# Patient Record
Sex: Male | Born: 1965 | Race: White | Hispanic: No | Marital: Married | Smoking: Former smoker
Health system: Southern US, Community
[De-identification: ages and names within clinical notes are randomized; demographics above are authoritative.]

## PROBLEM LIST (undated history)

## (undated) DIAGNOSIS — E785 Hyperlipidemia, unspecified: Secondary | ICD-10-CM

## (undated) DIAGNOSIS — E0590 Thyrotoxicosis, unspecified without thyrotoxic crisis or storm: Principal | ICD-10-CM

## (undated) DIAGNOSIS — G43909 Migraine, unspecified, not intractable, without status migrainosus: Secondary | ICD-10-CM

## (undated) DIAGNOSIS — I10 Essential (primary) hypertension: Secondary | ICD-10-CM

## (undated) DIAGNOSIS — R011 Cardiac murmur, unspecified: Secondary | ICD-10-CM

## (undated) DIAGNOSIS — E039 Hypothyroidism, unspecified: Secondary | ICD-10-CM

## (undated) HISTORY — DX: Hyperlipidemia, unspecified: E78.5

## (undated) HISTORY — DX: Migraine, unspecified, not intractable, without status migrainosus: G43.909

## (undated) HISTORY — DX: Cardiac murmur, unspecified: R01.1

## (undated) HISTORY — DX: Thyrotoxicosis, unspecified without thyrotoxic crisis or storm: E05.90

---
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
## 2004-11-10 HISTORY — PX: COLONOSCOPY: SHX174

## 2005-02-24 ENCOUNTER — Ambulatory Visit: Payer: Self-pay | Admitting: Family Medicine

## 2005-03-10 ENCOUNTER — Ambulatory Visit: Payer: Self-pay | Admitting: Gastroenterology

## 2005-03-12 ENCOUNTER — Ambulatory Visit: Payer: Self-pay | Admitting: Gastroenterology

## 2005-03-19 ENCOUNTER — Ambulatory Visit: Payer: Self-pay | Admitting: Gastroenterology

## 2005-04-08 ENCOUNTER — Ambulatory Visit: Payer: Self-pay | Admitting: Gastroenterology

## 2005-09-03 ENCOUNTER — Ambulatory Visit: Payer: Self-pay | Admitting: Family Medicine

## 2005-09-09 ENCOUNTER — Ambulatory Visit: Payer: Self-pay | Admitting: Family Medicine

## 2005-12-25 ENCOUNTER — Ambulatory Visit: Payer: Self-pay | Admitting: Family Medicine

## 2007-01-27 ENCOUNTER — Ambulatory Visit: Payer: Self-pay | Admitting: Family Medicine

## 2007-01-27 LAB — CONVERTED CEMR LAB
ALT: 30 units/L (ref 0–40)
Basophils Relative: 0.4 % (ref 0.0–1.0)
Bilirubin, Direct: 0.1 mg/dL (ref 0.0–0.3)
CO2: 32 meq/L (ref 19–32)
Calcium: 9.4 mg/dL (ref 8.4–10.5)
Eosinophils Absolute: 0.1 10*3/uL (ref 0.0–0.6)
Eosinophils Relative: 2.2 % (ref 0.0–5.0)
GFR calc Af Amer: 121 mL/min
Glucose, Bld: 99 mg/dL (ref 70–99)
HCT: 46.3 % (ref 39.0–52.0)
Hemoglobin: 15.8 g/dL (ref 13.0–17.0)
Lymphocytes Relative: 28.3 % (ref 12.0–46.0)
MCV: 88.6 fL (ref 78.0–100.0)
Monocytes Absolute: 0.7 10*3/uL (ref 0.2–0.7)
Neutro Abs: 3.9 10*3/uL (ref 1.4–7.7)
Neutrophils Relative %: 57.9 % (ref 43.0–77.0)
Potassium: 4.1 meq/L (ref 3.5–5.1)
Sodium: 146 meq/L — ABNORMAL HIGH (ref 135–145)
TSH: 0.86 microintl units/mL (ref 0.35–5.50)
Total Protein: 7 g/dL (ref 6.0–8.3)
VLDL: 23 mg/dL (ref 0–40)
WBC: 6.5 10*3/uL (ref 4.5–10.5)

## 2008-02-16 ENCOUNTER — Ambulatory Visit: Payer: Self-pay | Admitting: Family Medicine

## 2008-02-16 DIAGNOSIS — E785 Hyperlipidemia, unspecified: Secondary | ICD-10-CM

## 2008-02-22 ENCOUNTER — Ambulatory Visit: Payer: Self-pay | Admitting: Family Medicine

## 2008-02-22 LAB — CONVERTED CEMR LAB
Ketones, urine, test strip: NEGATIVE
Nitrite: NEGATIVE
Specific Gravity, Urine: 1.015
pH: 8.5

## 2008-02-28 LAB — CONVERTED CEMR LAB
ALT: 27 units/L (ref 0–53)
BUN: 12 mg/dL (ref 6–23)
Basophils Relative: 0.1 % (ref 0.0–1.0)
CO2: 30 meq/L (ref 19–32)
Calcium: 9.4 mg/dL (ref 8.4–10.5)
Cholesterol: 168 mg/dL (ref 0–200)
Creatinine, Ser: 0.9 mg/dL (ref 0.4–1.5)
Glucose, Bld: 91 mg/dL (ref 70–99)
Hemoglobin: 15.5 g/dL (ref 13.0–17.0)
LDL Cholesterol: 96 mg/dL (ref 0–99)
Lymphocytes Relative: 29.2 % (ref 12.0–46.0)
Monocytes Relative: 9.4 % (ref 3.0–12.0)
Neutro Abs: 3.8 10*3/uL (ref 1.4–7.7)
RBC: 5.16 M/uL (ref 4.22–5.81)
TSH: 1.05 microintl units/mL (ref 0.35–5.50)
Total CHOL/HDL Ratio: 3.6
Total Protein: 6.9 g/dL (ref 6.0–8.3)

## 2008-07-05 ENCOUNTER — Ambulatory Visit: Payer: Self-pay | Admitting: Family Medicine

## 2008-07-05 DIAGNOSIS — K219 Gastro-esophageal reflux disease without esophagitis: Secondary | ICD-10-CM | POA: Insufficient documentation

## 2009-04-12 ENCOUNTER — Ambulatory Visit: Payer: Self-pay | Admitting: Family Medicine

## 2009-04-12 LAB — CONVERTED CEMR LAB
Glucose, Urine, Semiquant: NEGATIVE
Specific Gravity, Urine: 1.02
WBC Urine, dipstick: NEGATIVE
pH: 7

## 2009-04-13 ENCOUNTER — Encounter: Payer: Self-pay | Admitting: Family Medicine

## 2009-04-13 LAB — CONVERTED CEMR LAB
ALT: 26 units/L (ref 0–53)
Basophils Relative: 0.4 % (ref 0.0–3.0)
Bilirubin, Direct: 0.2 mg/dL (ref 0.0–0.3)
Eosinophils Relative: 1.2 % (ref 0.0–5.0)
GFR calc non Af Amer: 98.97 mL/min (ref >60)
Glucose, Bld: 87 mg/dL (ref 70–99)
HCT: 45 % (ref 39.0–52.0)
Hemoglobin: 15.4 g/dL (ref 13.0–17.0)
Monocytes Relative: 11.7 % (ref 3.0–12.0)
Potassium: 4.2 meq/L (ref 3.5–5.1)
RBC: 4.95 M/uL (ref 4.22–5.81)
RDW: 12.3 % (ref 11.5–14.6)
Sodium: 142 meq/L (ref 135–145)
Total Bilirubin: 2 mg/dL — ABNORMAL HIGH (ref 0.3–1.2)
VLDL: 17.4 mg/dL (ref 0.0–40.0)

## 2009-04-23 ENCOUNTER — Ambulatory Visit: Payer: Self-pay | Admitting: Family Medicine

## 2009-04-23 DIAGNOSIS — N508 Other specified disorders of male genital organs: Secondary | ICD-10-CM

## 2009-04-23 DIAGNOSIS — G43909 Migraine, unspecified, not intractable, without status migrainosus: Secondary | ICD-10-CM

## 2009-04-26 ENCOUNTER — Encounter: Admission: RE | Admit: 2009-04-26 | Discharge: 2009-04-26 | Payer: Self-pay | Admitting: Family Medicine

## 2009-10-26 ENCOUNTER — Ambulatory Visit: Payer: Self-pay | Admitting: Family Medicine

## 2009-10-26 LAB — CONVERTED CEMR LAB
Albumin: 4.5 g/dL (ref 3.5–5.2)
Cholesterol: 142 mg/dL (ref 0–200)
HDL: 55.8 mg/dL (ref >39.00)
LDL Cholesterol: 74 mg/dL (ref 0–99)
Total Protein: 6.9 g/dL (ref 6.0–8.3)
Triglycerides: 63 mg/dL (ref 0.0–149.0)
VLDL: 12.6 mg/dL (ref 0.0–40.0)

## 2010-05-03 ENCOUNTER — Telehealth: Payer: Self-pay | Admitting: Family Medicine

## 2010-12-10 NOTE — Progress Notes (Signed)
Summary: new rx  Phone Note Call from Patient Call back at [phone]   Caller: Patient Call For: Nelwyn Salisbury MD Summary of Call: pt new sumatriptan 100 mg [phone] Initial call taken by: Heron Sabins,  May 03, 2010 1:46 PM  Follow-up for Phone Call        call in #12 with 11 rf Follow-up by: Nelwyn Salisbury MD,  May 03, 2010 3:00 PM    Prescriptions: SUMATRIPTAN SUCCINATE 100 MG TABS (SUMATRIPTAN SUCCINATE) as needed  #12 x 11   Entered by:   Raechel Ache, RN   Authorized by:   Nelwyn Salisbury MD   Signed by:   Raechel Ache, RN on 05/03/2010   Method used:   Electronically to        [facility]  [phone]* (retail)       [address] [place], [place]  [address]       Ph: [phone] or [phone]       Fax: [phone]   RxID:   [id number]

## 2011-01-09 ENCOUNTER — Other Ambulatory Visit: Payer: Self-pay | Admitting: Family Medicine

## 2011-01-24 ENCOUNTER — Encounter: Payer: Self-pay | Admitting: Family Medicine

## 2011-01-27 ENCOUNTER — Ambulatory Visit (INDEPENDENT_AMBULATORY_CARE_PROVIDER_SITE_OTHER): Payer: BC Managed Care – PPO | Admitting: Internal Medicine

## 2011-01-27 ENCOUNTER — Encounter: Payer: Self-pay | Admitting: Internal Medicine

## 2011-01-27 DIAGNOSIS — R51 Headache: Secondary | ICD-10-CM

## 2011-01-27 MED ORDER — SUMATRIPTAN SUCCINATE 100 MG PO TABS: 100.0000 mg | ORAL_TABLET | ORAL | Status: DC | PRN

## 2011-02-01 ENCOUNTER — Encounter: Payer: Self-pay | Admitting: Internal Medicine

## 2011-02-01 NOTE — Assessment & Plan Note (Signed)
Recent increase in HA pattern but without warning signs. Attempt sample of maxalt 10mg  mlt to evaluate effectiveness. If no better than maxalt RF maxalt with prescription provided. Consider neurology consult if pattern remains atypical.

## 2011-02-01 NOTE — Progress Notes (Signed)
  Subjective:    Patient ID: Derrick Medina, male    DOB: [birth year], 45 y.o.   MRN: [id number]  HPI Pt presents to clinic for evaluation of HA's. Notes longstanding h/o intermittent ha's thought to be migraines. Typically occur with season changes however have worsened over the last one month without obvious trigger. Occur daily for several days though last ha recently occurred 3d ago. Has associated nausea and photophobia but specifically denies neurologic sx's such as numbness, weakness, visual changes or difficulty with speech. No aura. Has responded well in the past to imitrex but recently notes less effectiveness. No obvious other aleviating or exacerbating factors. No other complaints.  Reviewed pmh, medications and allergies.    Review of Systems  Constitutional: Negative for fever and chills.  Neurological: Positive for headaches. Negative for tremors, seizures, syncope, speech difficulty, weakness and numbness.       Objective:   Physical Exam  Nursing note and vitals reviewed. Constitutional: He appears well-developed and well-nourished. No distress.  Neurological: He is alert.  Psychiatric: He has a normal mood and affect.          Assessment & Plan:

## 2011-05-26 ENCOUNTER — Ambulatory Visit (INDEPENDENT_AMBULATORY_CARE_PROVIDER_SITE_OTHER): Payer: BC Managed Care – PPO | Admitting: Family Medicine

## 2011-05-26 ENCOUNTER — Encounter: Payer: Self-pay | Admitting: Family Medicine

## 2011-05-26 VITALS — BP 110/70 | Temp 98.7°F | Wt 157.0 lb

## 2011-05-26 DIAGNOSIS — H00019 Hordeolum externum unspecified eye, unspecified eyelid: Secondary | ICD-10-CM

## 2011-05-26 MED ORDER — TOBRAMYCIN 0.3 % OP SOLN: 1.0000 [drp] | OPHTHALMIC | Status: AC

## 2011-05-26 NOTE — Patient Instructions (Signed)
Continue with warm compresses several times daily. Touch base or follow up immediately for any vision changes or worsening pain.

## 2011-05-26 NOTE — Progress Notes (Signed)
  Subjective:    Patient ID: Derrick Medina, male    DOB: 11-Feb-1966, 45 y.o.   MRN: [id number]  HPI Patient seen with pain in left upper eyelid that started last Thursday. Has noticed a little crusted drainage from around lid past couple of days. No blurred vision. No eye injury. No periocular rash no contact use. Patient noticed white colored bump underneath the left upper lateral lid. Use warm compresses only once or twice over the weekend. Symptoms actually slightly improved today   Review of Systems  Constitutional: Negative for fever and chills.  Eyes: Positive for pain and discharge. Negative for photophobia, itching and visual disturbance.       Objective:   Physical Exam  Constitutional: He appears well-developed and well-nourished. No distress.  Eyes: Pupils are equal, round, and reactive to light.       Very mild edema left upper lid. Conjunctiva appears normal. Pupils equal round reactive to light. Cornea appears normal. Funduscopic exam is unremarkable. With eversion of the left upper lid he has small stye on the lateral portion.  No vesicles involving eyelid or face.  Cardiovascular: Normal rate and regular rhythm.   Skin: No rash noted.          Assessment & Plan:  Stye left upper mid. Warm compresses several times daily. Tobrex solution 2 drops left eye every 4 hours while awake

## 2011-05-27 ENCOUNTER — Ambulatory Visit: Payer: BC Managed Care – PPO | Admitting: Family Medicine

## 2011-10-22 ENCOUNTER — Encounter: Payer: Self-pay | Admitting: Family Medicine

## 2011-10-22 ENCOUNTER — Ambulatory Visit (INDEPENDENT_AMBULATORY_CARE_PROVIDER_SITE_OTHER): Payer: BC Managed Care – PPO | Admitting: Family Medicine

## 2011-10-22 VITALS — BP 158/98 | HR 107 | Temp 98.7°F | Wt 149.0 lb

## 2011-10-22 DIAGNOSIS — S92919A Unspecified fracture of unspecified toe(s), initial encounter for closed fracture: Secondary | ICD-10-CM

## 2011-10-22 DIAGNOSIS — Z23 Encounter for immunization: Secondary | ICD-10-CM

## 2011-10-22 NOTE — Progress Notes (Signed)
  Subjective:    Patient ID: Derrick Medina, male    DOB: Feb 17, 1966, 45 y.o.   MRN: [id number]  HPI Last night at home he walked into a dog barrier and hit the right 5th toe. It was bent out to the side, and he pushed it back in a straight line. He felt a lot of crunching when he did this. The toe is painful and swollen. He has done nothing for this.    Review of Systems  Constitutional: Negative.   Musculoskeletal: Positive for arthralgias.       Objective:   Physical Exam  Constitutional:       limping  Musculoskeletal:       The right 5th toe is ecchymotic , swollen, and tender. There is crepitus present. Alignment is good. No tenderness in the forefoot           Assessment & Plan:  Probable broken toe. Wear comfortable shoes. Stay off his feet and keep it elevated. Use ice packs. Use Motrin. Get an Xray

## 2011-11-21 ENCOUNTER — Encounter: Payer: Self-pay | Admitting: Family Medicine

## 2011-11-21 ENCOUNTER — Ambulatory Visit (INDEPENDENT_AMBULATORY_CARE_PROVIDER_SITE_OTHER): Payer: BC Managed Care – PPO | Admitting: Family Medicine

## 2011-11-21 VITALS — BP 144/88 | HR 104 | Temp 98.3°F | Wt 149.0 lb

## 2011-11-21 DIAGNOSIS — E0590 Thyrotoxicosis, unspecified without thyrotoxic crisis or storm: Secondary | ICD-10-CM

## 2011-11-21 DIAGNOSIS — E785 Hyperlipidemia, unspecified: Secondary | ICD-10-CM

## 2011-11-21 DIAGNOSIS — R634 Abnormal weight loss: Secondary | ICD-10-CM

## 2011-11-21 LAB — POCT URINALYSIS DIPSTICK
Bilirubin, UA: NEGATIVE
Ketones, UA: NEGATIVE
Protein, UA: NEGATIVE
Spec Grav, UA: 1.02
pH, UA: 6

## 2011-11-21 LAB — BASIC METABOLIC PANEL
Chloride: 105 mEq/L (ref 96–112)
GFR: 184.06 mL/min (ref >60.00)
Potassium: 4.1 mEq/L (ref 3.5–5.1)

## 2011-11-21 LAB — T4, FREE: Free T4: 4.3 ng/dL — ABNORMAL HIGH (ref 0.60–1.60)

## 2011-11-21 LAB — CBC WITH DIFFERENTIAL/PLATELET
Basophils Relative: 0.2 % (ref 0.0–3.0)
Eosinophils Relative: 3.8 % (ref 0.0–5.0)
MCV: 81.7 fl (ref 78.0–100.0)
Monocytes Absolute: 0.8 10*3/uL (ref 0.1–1.0)
Monocytes Relative: 11.9 % (ref 3.0–12.0)
Neutrophils Relative %: 50.7 % (ref 43.0–77.0)
Platelets: 172 10*3/uL (ref 150.0–400.0)
RBC: 5.36 Mil/uL (ref 4.22–5.81)
WBC: 6.9 10*3/uL (ref 4.5–10.5)

## 2011-11-21 LAB — HEPATIC FUNCTION PANEL
ALT: 64 U/L — ABNORMAL HIGH (ref 0–53)
AST: 34 U/L (ref 0–37)
Bilirubin, Direct: 0.2 mg/dL (ref 0.0–0.3)
Total Bilirubin: 1.9 mg/dL — ABNORMAL HIGH (ref 0.3–1.2)

## 2011-11-21 LAB — TSH: TSH: 0.1 u[IU]/mL — ABNORMAL LOW (ref 0.35–5.50)

## 2011-11-21 LAB — LIPID PANEL
Cholesterol: 181 mg/dL (ref 0–200)
HDL: 48 mg/dL (ref >39.00)
LDL Cholesterol: 112 mg/dL — ABNORMAL HIGH (ref 0–99)
Total CHOL/HDL Ratio: 4
Triglycerides: 105 mg/dL (ref 0.0–149.0)
VLDL: 21 mg/dL (ref 0.0–40.0)

## 2011-11-21 NOTE — Progress Notes (Signed)
  Subjective:    Patient ID: Derrick Medina, male    DOB: January 25, 1966, 46 y.o.   MRN: [id number]  HPI Here for abnormal weight loss. Over the past 6 months he has started to rapidly lose weight without trying. He lost some weight last year due to a diet and exercise program, but now he is eating everything in sight and still losing weight. He weighed 162 at a visit here on 01-27-11 and he weighs 149 today. He feels nervous and his hands are shaky. His BMs are not loose but are much more frequent than usual. He feels hot all the time and is not wearing a coat, even though it is wintertime. His mother has had thyroid problems.    Review of Systems  Constitutional: Positive for unexpected weight change. Negative for activity change and appetite change.  Respiratory: Negative.   Cardiovascular: Negative.   Gastrointestinal: Negative.   Genitourinary: Negative.   Neurological: Positive for tremors and weakness.       Objective:   Physical Exam  Constitutional:       Thin, shaky, appears nervous   Neck:       The entire thyroid is mildly  enlarged, smooth, not tender   Cardiovascular: Regular rhythm, normal heart sounds and intact distal pulses.  Exam reveals no gallop and no friction rub.   No murmur heard.      Rapid rate   Pulmonary/Chest: Effort normal and breath sounds normal.  Lymphadenopathy:    He has no cervical adenopathy.          Assessment & Plan:  Probable hyperthryoidism. Get labs including a complete thyroid panel.

## 2011-11-21 NOTE — Progress Notes (Signed)
Addended by: Gershon Crane A on: 11/21/2011 05:23 PM   Modules accepted: Orders

## 2011-11-24 NOTE — Progress Notes (Signed)
Quick Note:  Spoke with pt ______ 

## 2011-11-25 ENCOUNTER — Encounter: Payer: Self-pay | Admitting: Endocrinology

## 2011-11-25 ENCOUNTER — Ambulatory Visit (INDEPENDENT_AMBULATORY_CARE_PROVIDER_SITE_OTHER): Payer: BC Managed Care – PPO | Admitting: Endocrinology

## 2011-11-25 DIAGNOSIS — E0590 Thyrotoxicosis, unspecified without thyrotoxic crisis or storm: Secondary | ICD-10-CM

## 2011-11-25 MED ORDER — ALPRAZOLAM 0.25 MG PO TABS: 0.2500 mg | ORAL_TABLET | Freq: Three times a day (TID) | ORAL | Status: AC | PRN

## 2011-11-25 MED ORDER — METOPROLOL SUCCINATE ER 25 MG PO TB24: ORAL_TABLET | ORAL | Status: DC

## 2011-11-25 MED ORDER — METHIMAZOLE 10 MG PO TABS: ORAL_TABLET | ORAL | Status: DC

## 2011-11-25 NOTE — Patient Instructions (Addendum)
i have sent a prescription to your pharmacy, to slow the thyroid down i have also sent a prescription to your pharmacy, to help you feel better in the meantime. Here is a prescription for "xanax" (general anti-anxiety pill) if ever you have fever while taking methimazole, stop it and call us, because of the risk of a rare side-effect Please come back for a follow-up appointment in 2-3 weeks.  If you still have symptoms, we can check the testosterone level along with thyroid blood tests, when you return. If i was you, i would improve the blood tests with the methimazole, then take the radioactive iodine treatment in a few months.  It goes like this: let's check a thyroid "scan" (a special, but easy and painless type of thyroid x ray).  It works like this: you go to the x-ray department of the hospital to swallow a pill, which contains a miniscule amount of radiation.  You will not notice any symptoms from this.  You will go back to the x-ray department the next day, to lie down in front of a camera.  The results of this will be sent to me.  please call [phone] to hear your test results.  You will be prompted to enter the 9-digit "MRN" number that appears at the top left of this page, followed by #.  Then you will hear the message. Based on the results, i hope to order for you a treatment pill of radioactive iodine.  Although it is a larger amount of radiation, you will again notice no symptoms from this.  The pill is gone from your body in a few days (during which you should stay away from other people), but takes several months to work.  Therefore, please return here approximately 6-8 weeks after the treatment.  This treatment has been available for many years, and the only known side-effect is an underactive thyroid.  It is possible that i would eventually prescribe for you a thyroid hormone pill, which is very inexpensive.  You don't have to worry about side-effects of this thyroid hormone pill, because  it is the same molecule your thyroid makes.

## 2011-11-25 NOTE — Progress Notes (Signed)
  Subjective:    Patient ID: Derrick Medina, male    DOB: [birth year], 46 y.o.   MRN: [id number]  HPI Pt states few mos moderate tremor of the hands, and assoc weight loss.   Past Medical History  Diagnosis Date  . Hyperlipidemia   . Heart murmur   . Migraines     No past surgical history on file.  History   Social History  . Marital Status: Married    Spouse Name: N/A    Number of Children: N/A  . Years of Education: N/A   Occupational History  . Not on file.   Social History Main Topics  . Smoking status: Former Games developer  . Smokeless tobacco: Never Used  . Alcohol Use: 1.5 oz/week    3 drink(s) per week  . Drug Use: No  . Sexually Active: Not on file   Other Topics Concern  . Not on file   Social History Narrative  . No narrative on file    Current Outpatient Prescriptions on File Prior to Visit  Medication Sig Dispense Refill  . SUMAtriptan (IMITREX) 100 MG tablet Take 1 tablet (100 mg total) by mouth every 2 (two) hours as needed for migraine.  12 tablet  6    No Known Allergies  Family History  Problem Relation Age of Onset  . 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  No change in chronic migraine.  He has palpitations, heat intolerance, and muscle weakness.  He has ED sxs.      Objective:   Physical Exam VS: see vs page GEN: no distress HEAD: head: no deformity eyes: no periorbital swelling, no proptosis external nose and ears are normal mouth: no lesion seen NECK: supple, thyroid is 5x normal size, right > left.  No nodule.   CHEST WALL: no deformity LUNGS: clear  to auscultation BREASTS:  No gynecomastia CV: tachycardic rate, but normal rhythm, no murmur ABD: abdomen is soft, nontender.  no hepatosplenomegaly.  not distended.  no hernia MUSCULOSKELETAL: muscle bulk and strength are grossly normal.  no obvious joint swelling.  gait is normal and steady EXTEMITIES: no deformity of the hands. no edema of the legs. NEURO:  cn 2-12 grossly intact.   readily moves all 4's.  sensation is intact to touch on the legs.  There is a moderate tremor of the hands. SKIN:  Normal texture and temperature.  No rash or suspicious lesion is visible.   NODES:  None palpable at the neck. PSYCH: alert, oriented x3.  Does not appear anxious nor depressed.  Slightly anxious.  Lab Results  Component Value Date   TSH 0.10* 11/21/2011      Assessment & Plan:  Grave's dz, hereditary Hyperthyroidism, due to the grave's dz.  He is too hyperthyroid now, to undergo i-131 rx. ED sxs.  This may be due to the catabolic effect of the hyperthyroidism.  We can eval if sxs persist when hyperthyroidism is better

## 2011-11-26 DIAGNOSIS — E039 Hypothyroidism, unspecified: Secondary | ICD-10-CM

## 2011-11-26 DIAGNOSIS — E0590 Thyrotoxicosis, unspecified without thyrotoxic crisis or storm: Secondary | ICD-10-CM | POA: Insufficient documentation

## 2011-11-26 HISTORY — DX: Hypothyroidism, unspecified: E03.9

## 2011-12-18 ENCOUNTER — Other Ambulatory Visit (INDEPENDENT_AMBULATORY_CARE_PROVIDER_SITE_OTHER): Payer: BC Managed Care – PPO

## 2011-12-18 ENCOUNTER — Encounter: Payer: Self-pay | Admitting: *Deleted

## 2011-12-18 ENCOUNTER — Ambulatory Visit (INDEPENDENT_AMBULATORY_CARE_PROVIDER_SITE_OTHER): Payer: BC Managed Care – PPO | Admitting: Endocrinology

## 2011-12-18 VITALS — BP 130/70 | HR 89 | Temp 97.5°F | Ht 70.0 in | Wt 153.2 lb

## 2011-12-18 DIAGNOSIS — E0590 Thyrotoxicosis, unspecified without thyrotoxic crisis or storm: Secondary | ICD-10-CM

## 2011-12-18 DIAGNOSIS — N529 Male erectile dysfunction, unspecified: Secondary | ICD-10-CM

## 2011-12-18 NOTE — Progress Notes (Signed)
  Subjective:    Patient ID: Derrick Medina, male    DOB: 07-30-1966, 46 y.o.   MRN: [id number]  HPI Pt returns for f/u of hyperthyroidism.  He feel only slightly better on the tapazole.   Past Medical History  Diagnosis Date  . Hyperlipidemia   . Heart murmur   . Migraines   . Hyperthyroidism 11/26/2011    No past surgical history on file.  History   Social History  . Marital Status: Married    Spouse Name: N/A    Number of Children: N/A  . Years of Education: N/A   Occupational History  . Not on file.   Social History Main Topics  . Smoking status: Former Games developer  . Smokeless tobacco: Never Used  . Alcohol Use: 1.5 oz/week    3 drink(s) per week  . Drug Use: No  . Sexually Active: Not on file   Other Topics Concern  . Not on file   Social History Narrative  . No narrative on file    Current Outpatient Prescriptions on File Prior to Visit  Medication Sig Dispense Refill  . ALPRAZolam (XANAX) 0.25 MG tablet Take 1 tablet (0.25 mg total) by mouth 3 (three) times daily as needed for sleep or anxiety.  30 tablet  0  . methimazole (TAPAZOLE) 10 MG tablet 4 pills, 2x a day  240 tablet  1  . metoprolol succinate (TOPROL-XL) 25 MG 24 hr tablet 1/2 pill, daily  30 tablet  1  . SUMAtriptan (IMITREX) 100 MG tablet Take 1 tablet (100 mg total) by mouth every 2 (two) hours as needed for migraine.  12 tablet  6    No Known Allergies  Family History  Problem Relation Age of Onset  . Hyperlipidemia      family hx    BP 130/70  Pulse 89  Temp(Src) 97.5 F (36.4 C) (Oral)  Ht 5\' 10"  (1.778 m)  Wt 153 lb 3.2 oz (69.491 kg)  BMI 21.98 kg/m2  SpO2 97%    Review of Systems Denies fever    Objective:   Physical Exam VITAL SIGNS:  See vs page GENERAL: no distress Thyroid: 4x normal size, no nodule Skin:  Slightly diaphoretic Neuro: slight postural tremor   Lab Results  Component Value Date   TSH 0.05* 12/18/2011  (i also reviewed T4 result) Lab Results    Component Value Date   TESTOSTERONE 512.44 12/18/2011      Assessment & Plan:  Hyperthyroidism, improved

## 2011-12-18 NOTE — Patient Instructions (Addendum)
blood tests are being requested for you today.  please call [phone] to hear your test results.  You will be prompted to enter the 9-digit "MRN" number that appears at the top left of this page, followed by #.  Then you will hear the message. if ever you have fever while taking methimazole, stop it and call us, because of the risk of a rare side-effect Please come back for a follow-up appointment in 1 month.   (update: i left message on phone-tree:  Same rx for now.  Testosterone is normal)

## 2011-12-19 LAB — TESTOSTERONE: Testosterone: 512.44 ng/dL (ref 350.00–890.00)

## 2012-01-21 ENCOUNTER — Encounter: Payer: Self-pay | Admitting: Endocrinology

## 2012-01-21 ENCOUNTER — Ambulatory Visit (INDEPENDENT_AMBULATORY_CARE_PROVIDER_SITE_OTHER): Payer: BC Managed Care – PPO | Admitting: Endocrinology

## 2012-01-21 ENCOUNTER — Other Ambulatory Visit (INDEPENDENT_AMBULATORY_CARE_PROVIDER_SITE_OTHER): Payer: BC Managed Care – PPO

## 2012-01-21 VITALS — BP 112/80 | HR 64 | Temp 97.5°F | Ht 70.0 in | Wt 159.0 lb

## 2012-01-21 DIAGNOSIS — N529 Male erectile dysfunction, unspecified: Secondary | ICD-10-CM

## 2012-01-21 DIAGNOSIS — E0590 Thyrotoxicosis, unspecified without thyrotoxic crisis or storm: Secondary | ICD-10-CM

## 2012-01-21 LAB — T4, FREE: Free T4: 0.53 ng/dL — ABNORMAL LOW (ref 0.60–1.60)

## 2012-01-21 NOTE — Progress Notes (Signed)
  Subjective:    Patient ID: Derrick Medina, male    DOB: 10/29/1966, 46 y.o.   MRN: [id number]  HPI Hyperthyroidism, due to the grave's dz.  He was too hyperthyroid to undergo i-131 rx, but he wants to pursue it now.  He takes tapazole as rx'ed.  He feels much better now.   Past Medical History  Diagnosis Date  . Hyperlipidemia   . Heart murmur   . Migraines   . Hyperthyroidism 11/26/2011    No past surgical history on file.  History   Social History  . Marital Status: Married    Spouse Name: N/A    Number of Children: N/A  . Years of Education: N/A   Occupational History  . Not on file.   Social History Main Topics  . Smoking status: Former Games developer  . Smokeless tobacco: Never Used  . Alcohol Use: 1.5 oz/week    3 drink(s) per week  . Drug Use: No  . Sexually Active: Not on file   Other Topics Concern  . Not on file   Social History Narrative  . No narrative on file    Current Outpatient Prescriptions on File Prior to Visit  Medication Sig Dispense Refill  . methimazole (TAPAZOLE) 10 MG tablet 4 pills, 2x a day  240 tablet  1  . metoprolol succinate (TOPROL-XL) 25 MG 24 hr tablet 1/2 pill, daily  30 tablet  1  . SUMAtriptan (IMITREX) 100 MG tablet Take 1 tablet (100 mg total) by mouth every 2 (two) hours as needed for migraine.  12 tablet  6    No Known Allergies  Family History  Problem Relation Age of Onset  . Hyperlipidemia      family hx    BP 112/80  Pulse 64  Temp(Src) 97.5 F (36.4 C) (Oral)  Ht 5\' 10"  (1.778 m)  Wt 159 lb (72.122 kg)  BMI 22.81 kg/m2  SpO2 97%  Review of Systems Denies fever    Objective:   Physical Exam VITAL SIGNS:  See vs page GENERAL: no distress Thyroid: 4x normal size, no nodule Neuro: slight tremor Skin: not diaphoretic   Lab Results  Component Value Date   TSH 0.03* 01/21/2012      Assessment & Plan:  Hyperthyroidism, improved.  He wants to pursue i-131 rx now, and i agree

## 2012-01-21 NOTE — Patient Instructions (Addendum)
blood tests are being requested for you today.  please call [phone] to hear your test results.  You will be prompted to enter the 9-digit "MRN" number that appears at the top left of this page, followed by #.  Then you will hear the message. Stop methimazole. If the blood test are high (even slightly high): We would check a thyroid "scan" (a special, but easy and painless type of thyroid x ray).  It works like this: you go to the x-ray department of the hospital to swallow a pill, which contains a miniscule amount of radiation.  You will not notice any symptoms from this.  You will go back to the x-ray department the next day, to lie down in front of a camera.  The results of this will be sent to me.  please call [phone] to hear your test results.  You will be prompted to enter the 9-digit "MRN" number that appears at the top left of this page, followed by #.  Then you will hear the message. Based on the results, i hope to order for you a treatment pill of radioactive iodine.  Although it is a larger amount of radiation, you will again notice no symptoms from this.  The pill is gone from your body in a few days (during which you should stay away from other people), but takes several months to work.  Therefore, please return here approximately 6-8 weeks after the treatment.  This treatment has been available for many years, and the only known side-effect is an underactive thyroid.  It is possible that i would eventually prescribe for you a thyroid hormone pill, which is very inexpensive.  You don't have to worry about side-effects of this thyroid hormone pill, because it is the same molecule your thyroid makes. (update: i left message on phone-tree: i ordered scan).

## 2012-01-22 ENCOUNTER — Telehealth: Payer: Self-pay

## 2012-01-22 NOTE — Telephone Encounter (Signed)
Please stop the methimazole After you do the test, i'll schedule the treatment asap.  i think this can be done by then

## 2012-01-22 NOTE — Telephone Encounter (Signed)
Pt called stating he would like to schedule I-131 for the Easter holiday and he is requesting MD's assistance in scheduling. Pt is also unsure of when he should d/c his medication prior to treatment, please advise.

## 2012-01-22 NOTE — Telephone Encounter (Signed)
Pt informed of MD's advisement. 

## 2012-01-29 ENCOUNTER — Encounter (HOSPITAL_COMMUNITY)
Admission: RE | Admit: 2012-01-29 | Discharge: 2012-01-29 | Disposition: A | Discharge status: Home / Self Care | Payer: BC Managed Care – PPO | Source: Ambulatory Visit | Attending: Endocrinology | Admitting: Endocrinology

## 2012-01-29 DIAGNOSIS — E0590 Thyrotoxicosis, unspecified without thyrotoxic crisis or storm: Secondary | ICD-10-CM | POA: Insufficient documentation

## 2012-01-30 ENCOUNTER — Encounter (HOSPITAL_COMMUNITY)
Admission: RE | Admit: 2012-01-30 | Discharge: 2012-01-30 | Disposition: A | Discharge status: Home / Self Care | Payer: BC Managed Care – PPO | Source: Ambulatory Visit | Attending: Endocrinology | Admitting: Endocrinology

## 2012-01-30 ENCOUNTER — Other Ambulatory Visit: Payer: Self-pay | Admitting: Endocrinology

## 2012-01-30 DIAGNOSIS — E0590 Thyrotoxicosis, unspecified without thyrotoxic crisis or storm: Secondary | ICD-10-CM

## 2012-01-30 MED ORDER — SODIUM PERTECHNETATE TC 99M INJECTION
11.0000 | Freq: Once | INTRAVENOUS | Status: AC | PRN
  Administered 2012-01-30: 11 via INTRAVENOUS

## 2012-01-30 MED ORDER — SODIUM IODIDE I 131 CAPSULE
14.0000 | Freq: Once | INTRAVENOUS | Status: AC | PRN
  Administered 2012-01-30: 14 via ORAL

## 2012-02-13 ENCOUNTER — Encounter (HOSPITAL_COMMUNITY)
Admission: RE | Admit: 2012-02-13 | Discharge: 2012-02-13 | Disposition: A | Discharge status: Home / Self Care | Payer: BC Managed Care – PPO | Source: Ambulatory Visit | Attending: Endocrinology | Admitting: Endocrinology

## 2012-02-13 DIAGNOSIS — E0590 Thyrotoxicosis, unspecified without thyrotoxic crisis or storm: Secondary | ICD-10-CM | POA: Insufficient documentation

## 2012-02-13 MED ORDER — SODIUM IODIDE I 131 CAPSULE
16.0000 | Freq: Once | INTRAVENOUS | Status: AC | PRN
  Administered 2012-02-13: 16 via ORAL

## 2012-03-26 ENCOUNTER — Other Ambulatory Visit (INDEPENDENT_AMBULATORY_CARE_PROVIDER_SITE_OTHER): Payer: BC Managed Care – PPO

## 2012-03-26 ENCOUNTER — Encounter: Payer: Self-pay | Admitting: Endocrinology

## 2012-03-26 ENCOUNTER — Ambulatory Visit (INDEPENDENT_AMBULATORY_CARE_PROVIDER_SITE_OTHER): Payer: BC Managed Care – PPO | Admitting: Endocrinology

## 2012-03-26 VITALS — BP 110/82 | HR 61 | Temp 97.9°F | Ht 70.0 in | Wt 163.0 lb

## 2012-03-26 DIAGNOSIS — E0590 Thyrotoxicosis, unspecified without thyrotoxic crisis or storm: Secondary | ICD-10-CM

## 2012-03-26 LAB — TSH: TSH: 0.04 u[IU]/mL — ABNORMAL LOW (ref 0.35–5.50)

## 2012-03-26 LAB — T4, FREE: Free T4: 1.7 ng/dL — ABNORMAL HIGH (ref 0.60–1.60)

## 2012-03-26 NOTE — Patient Instructions (Addendum)
blood tests are being requested for you today.  You will receive a letter with results. Please come back for a follow-up appointment for 1 month.  Please make an appointment.

## 2012-03-26 NOTE — Progress Notes (Signed)
  Subjective:    Patient ID: Derrick Medina, male    DOB: 11/14/1965, 46 y.o.   MRN: [id number]  HPI Pt is 6 weeks s/p i-131 rx for hyperthyroidism, due to grave's dz.  pt states he feels well in general.  He has gained a few lbs.  Past Medical History  Diagnosis Date  . Hyperlipidemia   . Heart murmur   . Migraines   . Hyperthyroidism 11/26/2011    No past surgical history on file.  History   Social History  . Marital Status: Married    Spouse Name: N/A    Number of Children: N/A  . Years of Education: N/A   Occupational History  . Not on file.   Social History Main Topics  . Smoking status: Former Games developer  . Smokeless tobacco: Never Used  . Alcohol Use: 1.5 oz/week    3 drink(s) per week  . Drug Use: No  . Sexually Active: Not on file   Other Topics Concern  . Not on file   Social History Narrative  . No narrative on file    Current Outpatient Prescriptions on File Prior to Visit  Medication Sig Dispense Refill  . SUMAtriptan (IMITREX) 100 MG tablet Take 1 tablet (100 mg total) by mouth every 2 (two) hours as needed for migraine.  12 tablet  6    No Known Allergies  Family History  Problem Relation Age of Onset  . Hyperlipidemia      family hx    BP 110/82  Pulse 61  Temp(Src) 97.9 F (36.6 C) (Oral)  Ht 5\' 10"  (1.778 m)  Wt 163 lb (73.936 kg)  BMI 23.39 kg/m2  SpO2 97%  Review of Systems Denies fever.      Objective:   Physical Exam VITAL SIGNS:  See vs page GENERAL: no distress Thyroid: 4x normal size, no nodule     Assessment & Plan:  Hyperthyroidism, clinically much better

## 2012-05-03 ENCOUNTER — Encounter: Payer: Self-pay | Admitting: Endocrinology

## 2012-05-03 ENCOUNTER — Other Ambulatory Visit (INDEPENDENT_AMBULATORY_CARE_PROVIDER_SITE_OTHER): Payer: BC Managed Care – PPO

## 2012-05-03 ENCOUNTER — Ambulatory Visit (INDEPENDENT_AMBULATORY_CARE_PROVIDER_SITE_OTHER): Payer: BC Managed Care – PPO | Admitting: Endocrinology

## 2012-05-03 ENCOUNTER — Other Ambulatory Visit: Payer: Self-pay | Admitting: Endocrinology

## 2012-05-03 VITALS — BP 130/88 | HR 65 | Temp 98.1°F | Ht 71.0 in | Wt 170.0 lb

## 2012-05-03 DIAGNOSIS — E0590 Thyrotoxicosis, unspecified without thyrotoxic crisis or storm: Secondary | ICD-10-CM

## 2012-05-03 LAB — TSH: TSH: 30.94 u[IU]/mL — ABNORMAL HIGH (ref 0.35–5.50)

## 2012-05-03 MED ORDER — LEVOTHYROXINE SODIUM 125 MCG PO TABS: 125.0000 ug | ORAL_TABLET | Freq: Every day | ORAL | Status: DC

## 2012-05-03 NOTE — Progress Notes (Signed)
  Subjective:    Patient ID: Derrick Medina, male    DOB: 1966/06/05, 46 y.o.   MRN: [id number]  HPI Pt is 2 1/2 months s/p i-131 rx for hyperthyroidism, due to grave's dz.  pt states he feels well in general.   Past Medical History  Diagnosis Date  . Hyperlipidemia   . Heart murmur   . Migraines   . Hyperthyroidism 11/26/2011    No past surgical history on file.  History   Social History  . Marital Status: Married    Spouse Name: N/A    Number of Children: N/A  . Years of Education: N/A   Occupational History  . Not on file.   Social History Main Topics  . Smoking status: Former Games developer  . Smokeless tobacco: Never Used  . Alcohol Use: 1.5 oz/week    3 drink(s) per week  . Drug Use: No  . Sexually Active: Not on file   Other Topics Concern  . Not on file   Social History Narrative  . No narrative on file    Current Outpatient Prescriptions on File Prior to Visit  Medication Sig Dispense Refill  . SUMAtriptan (IMITREX) 100 MG tablet Take 1 tablet (100 mg total) by mouth every 2 (two) hours as needed for migraine.  12 tablet  6    No Known Allergies  Family History  Problem Relation Age of Onset  . Hyperlipidemia      family hx    BP 130/88  Pulse 65  Temp 98.1 F (36.7 C) (Oral)  Ht 5\' 11"  (1.803 m)  Wt 170 lb (77.111 kg)  BMI 23.71 kg/m2  SpO2 98%   Review of Systems Denies weight change    Objective:   Physical Exam VITAL SIGNS:  See vs page GENERAL: no distress NECK: There is no palpable thyroid enlargement.  No thyroid nodule is palpable.  No palpable lymphadenopathy at the anterior neck.      Assessment & Plan:  Hyperthyroidism.  Clinically improved

## 2012-05-03 NOTE — Patient Instructions (Signed)
blood tests are being requested for you today.  You will receive a letter with results.  Please come back for a follow-up appointment for 1 month. 

## 2012-06-17 ENCOUNTER — Ambulatory Visit (INDEPENDENT_AMBULATORY_CARE_PROVIDER_SITE_OTHER): Payer: BC Managed Care – PPO | Admitting: Endocrinology

## 2012-06-17 ENCOUNTER — Encounter: Payer: Self-pay | Admitting: Endocrinology

## 2012-06-17 ENCOUNTER — Other Ambulatory Visit (INDEPENDENT_AMBULATORY_CARE_PROVIDER_SITE_OTHER): Payer: BC Managed Care – PPO

## 2012-06-17 VITALS — BP 102/78 | HR 81 | Temp 97.5°F | Wt 173.0 lb

## 2012-06-17 DIAGNOSIS — E890 Postprocedural hypothyroidism: Secondary | ICD-10-CM

## 2012-06-17 DIAGNOSIS — E039 Hypothyroidism, unspecified: Secondary | ICD-10-CM | POA: Insufficient documentation

## 2012-06-17 NOTE — Progress Notes (Signed)
  Subjective:    Patient ID: Derrick Medina, male    DOB: 1966-04-07, 46 y.o.   MRN: [id number]  HPI Pt is 4 months s/p i-131 rx for hyperthyroidism, due to grave's dz.  pt states he feels well in general.  He takes the synthroid as rx'ed.   Past Medical History  Diagnosis Date  . Hyperlipidemia   . Heart murmur   . Migraines   . Hyperthyroidism 11/26/2011    No past surgical history on file.  History   Social History  . Marital Status: Married    Spouse Name: N/A    Number of Children: N/A  . Years of Education: N/A   Occupational History  . Not on file.   Social History Main Topics  . Smoking status: Former Games developer  . Smokeless tobacco: Never Used  . Alcohol Use: 1.5 oz/week    3 drink(s) per week  . Drug Use: No  . Sexually Active: Not on file   Other Topics Concern  . Not on file   Social History Narrative  . No narrative on file    Current Outpatient Prescriptions on File Prior to Visit  Medication Sig Dispense Refill  . levothyroxine (SYNTHROID, LEVOTHROID) 125 MCG tablet Take 1 tablet (125 mcg total) by mouth daily.  30 tablet  1  . SUMAtriptan (IMITREX) 100 MG tablet Take 1 tablet (100 mg total) by mouth every 2 (two) hours as needed for migraine.  12 tablet  6    No Known Allergies  Family History  Problem Relation Age of Onset  . Hyperlipidemia      family hx    BP 102/78  Pulse 81  Temp 97.5 F (36.4 C) (Oral)  Wt 173 lb (78.472 kg)  SpO2 97%    Review of Systems Denies weight change    Objective:   Physical Exam VITAL SIGNS:  See vs page GENERAL: no distress NECK: There is no palpable thyroid enlargement.  No thyroid nodule is palpable.  No palpable lymphadenopathy at the anterior neck.    Lab Results  Component Value Date   TSH 4.21 06/17/2012      Assessment & Plan:  Post-i-131 hypothyroidism, well-replaced

## 2012-06-17 NOTE — Patient Instructions (Addendum)
blood tests are being requested for you today.  You will receive a letter with results. Please come back for a follow-up appointment in 2 months.

## 2012-07-05 ENCOUNTER — Other Ambulatory Visit: Payer: Self-pay | Admitting: Endocrinology

## 2012-07-28 ENCOUNTER — Other Ambulatory Visit: Payer: Self-pay | Admitting: *Deleted

## 2012-07-28 MED ORDER — LEVOTHYROXINE SODIUM 125 MCG PO TABS: ORAL_TABLET | ORAL | Status: DC

## 2012-07-28 NOTE — Telephone Encounter (Signed)
R'cd fax from [facility] for refill of Levothyroxine for 90 day supply 

## 2012-09-22 ENCOUNTER — Ambulatory Visit (INDEPENDENT_AMBULATORY_CARE_PROVIDER_SITE_OTHER): Payer: BC Managed Care – PPO | Admitting: Endocrinology

## 2012-09-22 ENCOUNTER — Encounter: Payer: Self-pay | Admitting: Endocrinology

## 2012-09-22 VITALS — BP 122/74 | HR 74 | Temp 97.7°F | Wt 177.0 lb

## 2012-09-22 DIAGNOSIS — E890 Postprocedural hypothyroidism: Secondary | ICD-10-CM

## 2012-09-22 NOTE — Patient Instructions (Addendum)
blood tests are being requested for you today.  We'll contact you with results. If today's blood test is normal, you can just come back here are needed.  Dr Clent Ridges would be happy to check your thyroid as part of your regular checkup in January.   90d

## 2012-09-22 NOTE — Progress Notes (Signed)
  Subjective:    Patient ID: Derrick Medina, male    DOB: 1966-01-03, 46 y.o.   MRN: [id number]  HPI Pt is 7 months s/p i-131 rx for hyperthyroidism, due to grave's dz.  pt states he feels well in general.  He takes the synthroid as rx'ed.  He has resumed exercise recently.   Past Medical History  Diagnosis Date  . Hyperlipidemia   . Heart murmur   . Migraines   . Hyperthyroidism 11/26/2011    No past surgical history on file.  History   Social History  . Marital Status: Married    Spouse Name: N/A    Number of Children: N/A  . Years of Education: N/A   Occupational History  . Not on file.   Social History Main Topics  . Smoking status: Former Games developer  . Smokeless tobacco: Never Used  . Alcohol Use: 1.5 oz/week    3 drink(s) per week  . Drug Use: No  . Sexually Active: Not on file   Other Topics Concern  . Not on file   Social History Narrative  . No narrative on file    Current Outpatient Prescriptions on File Prior to Visit  Medication Sig Dispense Refill  . levothyroxine (SYNTHROID, LEVOTHROID) 125 MCG tablet TAKE 1 TABLET BY MOUTH EVERY DAY  90 tablet  1  . SUMAtriptan (IMITREX) 100 MG tablet Take 1 tablet (100 mg total) by mouth every 2 (two) hours as needed for migraine.  12 tablet  6    No Known Allergies  Family History  Problem Relation Age of Onset  . Hyperlipidemia      family hx    BP 122/74  Pulse 74  Temp 97.7 F (36.5 C) (Oral)  Wt 177 lb (80.287 kg)  SpO2 97%  Review of Systems Denies weight change    Objective:   Physical Exam VITAL SIGNS:  See vs page GENERAL: no distress NECK: There is no palpable thyroid enlargement.  No thyroid nodule is palpable.  No palpable lymphadenopathy at the anterior neck.     Assessment & Plan:  Post-i-131 hypothyroidism, on synthroid

## 2012-09-23 ENCOUNTER — Other Ambulatory Visit: Payer: Self-pay | Admitting: Endocrinology

## 2012-09-23 MED ORDER — LEVOTHYROXINE SODIUM 137 MCG PO TABS: 137.0000 ug | ORAL_TABLET | Freq: Every day | ORAL | Status: DC

## 2012-10-13 ENCOUNTER — Ambulatory Visit (INDEPENDENT_AMBULATORY_CARE_PROVIDER_SITE_OTHER): Payer: BC Managed Care – PPO | Admitting: Family Medicine

## 2012-10-13 DIAGNOSIS — Z23 Encounter for immunization: Secondary | ICD-10-CM

## 2012-12-06 ENCOUNTER — Telehealth: Payer: Self-pay | Admitting: Family Medicine

## 2012-12-06 NOTE — Telephone Encounter (Signed)
yes

## 2012-12-06 NOTE — Telephone Encounter (Signed)
Pt has wellness form to be completed by end of feb. Can I work pt in for cpx?

## 2012-12-07 NOTE — Telephone Encounter (Signed)
Pt is sch for 12-14-2012 8.30am

## 2012-12-14 ENCOUNTER — Encounter: Payer: Self-pay | Admitting: Family Medicine

## 2012-12-14 ENCOUNTER — Ambulatory Visit (INDEPENDENT_AMBULATORY_CARE_PROVIDER_SITE_OTHER): Payer: BC Managed Care – PPO | Admitting: Family Medicine

## 2012-12-14 VITALS — BP 130/88 | HR 67 | Temp 98.3°F | Ht 70.5 in | Wt 179.0 lb

## 2012-12-14 DIAGNOSIS — Z0000 Encounter for general adult medical examination without abnormal findings: Secondary | ICD-10-CM

## 2012-12-14 LAB — CBC WITH DIFFERENTIAL/PLATELET
Basophils Absolute: 0 10*3/uL (ref 0.0–0.1)
Eosinophils Relative: 4.9 % (ref 0.0–5.0)
Lymphs Abs: 1.9 10*3/uL (ref 0.7–4.0)
Monocytes Absolute: 0.6 10*3/uL (ref 0.1–1.0)
Monocytes Relative: 8.4 % (ref 3.0–12.0)
Neutrophils Relative %: 59.4 % (ref 43.0–77.0)
Platelets: 209 10*3/uL (ref 150.0–400.0)
RDW: 13.1 % (ref 11.5–14.6)
WBC: 6.9 10*3/uL (ref 4.5–10.5)

## 2012-12-14 LAB — POCT URINALYSIS DIPSTICK
Glucose, UA: NEGATIVE
Nitrite, UA: NEGATIVE
Protein, UA: NEGATIVE
Urobilinogen, UA: 0.2

## 2012-12-14 LAB — BASIC METABOLIC PANEL
BUN: 18 mg/dL (ref 6–23)
CO2: 28 mEq/L (ref 19–32)
Calcium: 9.1 mg/dL (ref 8.4–10.5)
Creatinine, Ser: 1 mg/dL (ref 0.4–1.5)
Glucose, Bld: 95 mg/dL (ref 70–99)

## 2012-12-14 LAB — TSH: TSH: 1.47 u[IU]/mL (ref 0.35–5.50)

## 2012-12-14 LAB — LIPID PANEL
Cholesterol: 261 mg/dL — ABNORMAL HIGH (ref 0–200)
Total CHOL/HDL Ratio: 6
Triglycerides: 217 mg/dL — ABNORMAL HIGH (ref 0.0–149.0)

## 2012-12-14 LAB — LDL CHOLESTEROL, DIRECT: Direct LDL: 155.8 mg/dL

## 2012-12-14 LAB — HEPATIC FUNCTION PANEL
ALT: 19 U/L (ref 0–53)
Albumin: 4.3 g/dL (ref 3.5–5.2)
Total Protein: 7.2 g/dL (ref 6.0–8.3)

## 2012-12-14 MED ORDER — LEVOTHYROXINE SODIUM 137 MCG PO TABS: 137.0000 ug | ORAL_TABLET | Freq: Every day | ORAL | Status: DC

## 2012-12-14 MED ORDER — SUMATRIPTAN SUCCINATE 100 MG PO TABS: 100.0000 mg | ORAL_TABLET | ORAL | Status: DC | PRN

## 2012-12-14 NOTE — Progress Notes (Signed)
  Subjective:    Patient ID: Derrick Medina, male    DOB: [birth year], 47 y.o.   MRN: [id number]  HPI 47 yr old male for a cpx. He feels well and has no complaints. One year ago he was found to have Graves disease and underwent radioactive ablation of his thyroid. He has been treated since then by Dr. Everardo All with thyroid replacement. He has gained 30 lbs in the past year. He used to run 6-7 days a week and he has dropped back to 2-3 days a week. He admits to not being very strict with his diet. He now intends to pay more attention to this again.    Review of Systems  Constitutional: Negative.   HENT: Negative.   Eyes: Negative.   Respiratory: Negative.   Cardiovascular: Negative.   Gastrointestinal: Negative.   Genitourinary: Negative.   Musculoskeletal: Negative.   Skin: Negative.   Neurological: Negative.   Hematological: Negative.   Psychiatric/Behavioral: Negative.        Objective:   Physical Exam  Constitutional: He is oriented to person, place, and time. He appears well-developed and well-nourished. No distress.  HENT:  Head: Normocephalic and atraumatic.  Right Ear: External ear normal.  Left Ear: External ear normal.  Nose: Nose normal.  Mouth/Throat: Oropharynx is clear and moist. No oropharyngeal exudate.  Eyes: Conjunctivae normal and EOM are normal. Pupils are equal, round, and reactive to light. Right eye exhibits no discharge. Left eye exhibits no discharge. No scleral icterus.  Neck: Neck supple. No JVD present. No tracheal deviation present. No thyromegaly present.  Cardiovascular: Normal rate, regular rhythm, normal heart sounds and intact distal pulses.  Exam reveals no gallop and no friction rub.   No murmur heard. Pulmonary/Chest: Effort normal and breath sounds normal. No respiratory distress. He has no wheezes. He has no rales. He exhibits no tenderness.  Abdominal: Soft. Bowel sounds are normal. He exhibits no distension and no mass. There is no tenderness.  There is no rebound and no guarding.  Genitourinary: Rectum normal, prostate normal and penis normal. Guaiac negative stool. No penile tenderness.  Musculoskeletal: Normal range of motion. He exhibits no edema and no tenderness.  Lymphadenopathy:    He has no cervical adenopathy.  Neurological: He is alert and oriented to person, place, and time. He has normal reflexes. No cranial nerve deficit. He exhibits normal muscle tone. Coordination normal.  Skin: Skin is warm and dry. No rash noted. He is not diaphoretic. No erythema. No pallor.  Psychiatric: He has a normal mood and affect. His behavior is normal. Judgment and thought content normal.          Assessment & Plan:  Well exam. Get labs today

## 2012-12-17 NOTE — Progress Notes (Signed)
Quick Note:  Sent Dr Claris Che note per My Chart to pt. ______

## 2013-09-15 ENCOUNTER — Other Ambulatory Visit: Payer: Self-pay

## 2013-12-19 ENCOUNTER — Other Ambulatory Visit: Payer: Self-pay | Admitting: Family Medicine

## 2013-12-20 NOTE — Telephone Encounter (Signed)
Should pt have labs done before we refill this?

## 2013-12-20 NOTE — Telephone Encounter (Signed)
He needs to get this from Dr. Loanne Drilling

## 2013-12-21 ENCOUNTER — Other Ambulatory Visit: Payer: Self-pay | Admitting: Family Medicine

## 2014-01-05 ENCOUNTER — Ambulatory Visit: Payer: BC Managed Care – PPO | Admitting: Family Medicine

## 2014-01-10 ENCOUNTER — Encounter: Payer: Self-pay | Admitting: Family Medicine

## 2014-01-10 ENCOUNTER — Ambulatory Visit (INDEPENDENT_AMBULATORY_CARE_PROVIDER_SITE_OTHER): Payer: BC Managed Care – PPO | Admitting: Family Medicine

## 2014-01-10 VITALS — BP 120/80 | HR 68 | Temp 98.0°F | Ht 70.5 in | Wt 178.0 lb

## 2014-01-10 DIAGNOSIS — R51 Headache: Secondary | ICD-10-CM

## 2014-01-10 DIAGNOSIS — E890 Postprocedural hypothyroidism: Secondary | ICD-10-CM

## 2014-01-10 DIAGNOSIS — E785 Hyperlipidemia, unspecified: Secondary | ICD-10-CM

## 2014-01-10 LAB — HEPATIC FUNCTION PANEL
ALBUMIN: 4.4 g/dL (ref 3.5–5.2)
ALK PHOS: 45 U/L (ref 39–117)
ALT: 17 U/L (ref 0–53)
AST: 24 U/L (ref 0–37)
Bilirubin, Direct: 0.2 mg/dL (ref 0.0–0.3)
TOTAL PROTEIN: 7.1 g/dL (ref 6.0–8.3)
Total Bilirubin: 2 mg/dL — ABNORMAL HIGH (ref 0.3–1.2)

## 2014-01-10 LAB — BASIC METABOLIC PANEL
BUN: 14 mg/dL (ref 6–23)
CALCIUM: 9.2 mg/dL (ref 8.4–10.5)
CO2: 27 meq/L (ref 19–32)
CREATININE: 1 mg/dL (ref 0.4–1.5)
Chloride: 103 mEq/L (ref 96–112)
GFR: 87.72 mL/min (ref >60.00)
GLUCOSE: 76 mg/dL (ref 70–99)
Potassium: 3.9 mEq/L (ref 3.5–5.1)
Sodium: 138 mEq/L (ref 135–145)

## 2014-01-10 LAB — CBC WITH DIFFERENTIAL/PLATELET
Basophils Absolute: 0 10*3/uL (ref 0.0–0.1)
Basophils Relative: 0.1 % (ref 0.0–3.0)
EOS PCT: 3 % (ref 0.0–5.0)
Eosinophils Absolute: 0.2 10*3/uL (ref 0.0–0.7)
HCT: 46.5 % (ref 39.0–52.0)
HEMOGLOBIN: 15.3 g/dL (ref 13.0–17.0)
LYMPHS PCT: 32.4 % (ref 12.0–46.0)
Lymphs Abs: 2.1 10*3/uL (ref 0.7–4.0)
MCHC: 33 g/dL (ref 30.0–36.0)
MCV: 90.4 fl (ref 78.0–100.0)
MONOS PCT: 11.2 % (ref 3.0–12.0)
Monocytes Absolute: 0.7 10*3/uL (ref 0.1–1.0)
NEUTROS ABS: 3.5 10*3/uL (ref 1.4–7.7)
NEUTROS PCT: 53.3 % (ref 43.0–77.0)
Platelets: 213 10*3/uL (ref 150.0–400.0)
RBC: 5.14 Mil/uL (ref 4.22–5.81)
RDW: 13.8 % (ref 11.5–14.6)
WBC: 6.5 10*3/uL (ref 4.5–10.5)

## 2014-01-10 LAB — LIPID PANEL
CHOL/HDL RATIO: 4
Cholesterol: 239 mg/dL — ABNORMAL HIGH (ref 0–200)
HDL: 55.6 mg/dL (ref >39.00)
LDL Cholesterol: 171 mg/dL — ABNORMAL HIGH (ref 0–99)
TRIGLYCERIDES: 60 mg/dL (ref 0.0–149.0)
VLDL: 12 mg/dL (ref 0.0–40.0)

## 2014-01-10 LAB — TSH: TSH: 0.4 u[IU]/mL (ref 0.35–5.50)

## 2014-01-10 MED ORDER — SUMATRIPTAN SUCCINATE 100 MG PO TABS: 100.0000 mg | ORAL_TABLET | ORAL | Status: DC | PRN

## 2014-01-10 MED ORDER — LEVOTHYROXINE SODIUM 137 MCG PO TABS
137.0000 ug | ORAL_TABLET | Freq: Every day | ORAL | Status: DC
Start: 2014-01-10 — End: 2015-01-16

## 2014-01-10 NOTE — Progress Notes (Signed)
Pre visit review using our clinic review tool, if applicable. No additional management support is needed unless otherwise documented below in the visit note. 

## 2014-01-10 NOTE — Progress Notes (Signed)
   Subjective:    Patient ID: Derrick Medina, male    DOB: [birth year], 48 y.o.   MRN: [id number]  HPI Here to follow up. He feels well. He is watching his diet and working out.    Review of Systems  Constitutional: Negative.   Respiratory: Negative.   Cardiovascular: Negative.   Endocrine: Negative.        Objective:   Physical Exam  Constitutional: He appears well-developed and well-nourished.  Neck: No thyromegaly present.  Cardiovascular: Normal rate, regular rhythm, normal heart sounds and intact distal pulses.   Pulmonary/Chest: Effort normal and breath sounds normal.  Lymphadenopathy:    He has no cervical adenopathy.          Assessment & Plan:  Get fasting labs

## 2014-04-25 ENCOUNTER — Encounter: Payer: Self-pay | Admitting: Family Medicine

## 2014-04-25 ENCOUNTER — Ambulatory Visit (INDEPENDENT_AMBULATORY_CARE_PROVIDER_SITE_OTHER): Payer: BC Managed Care – PPO | Admitting: Family Medicine

## 2014-04-25 VITALS — BP 112/90 | Temp 98.1°F | Ht 70.5 in | Wt 176.0 lb

## 2014-04-25 DIAGNOSIS — E890 Postprocedural hypothyroidism: Secondary | ICD-10-CM

## 2014-04-25 DIAGNOSIS — R6882 Decreased libido: Secondary | ICD-10-CM

## 2014-04-25 DIAGNOSIS — E785 Hyperlipidemia, unspecified: Secondary | ICD-10-CM

## 2014-04-25 LAB — LIPID PANEL
CHOL/HDL RATIO: 3
Cholesterol: 210 mg/dL — ABNORMAL HIGH (ref 0–200)
HDL: 61.2 mg/dL (ref >39.00)
LDL CALC: 139 mg/dL — AB (ref 0–99)
NONHDL: 148.8
TRIGLYCERIDES: 47 mg/dL (ref 0.0–149.0)
VLDL: 9.4 mg/dL (ref 0.0–40.0)

## 2014-04-25 LAB — TESTOSTERONE: Testosterone: 192.25 ng/dL — ABNORMAL LOW (ref 300.00–890.00)

## 2014-04-25 LAB — TSH: TSH: 2.26 u[IU]/mL (ref 0.35–4.50)

## 2014-04-25 NOTE — Progress Notes (Signed)
   Subjective:    Patient ID: Derrick Medina, male    DOB: [birth year], 48 y.o.   MRN: [id number]  HPI Here to follow up and to get some fasting labs. He decided not to start on Lipitor and has been dieting and exercising instead. He is doing Education officer, environmental about 6 days a week.Marland Kitchen He does complain of some mood changes in the past few months. He feels emotional flatness at times and dinds it hard to get excited about anything. He has lost some sex drive as well. He denies any sadness and says there is little stress in his life.    Review of Systems  Constitutional: Negative.   Respiratory: Negative.   Cardiovascular: Negative.   Psychiatric/Behavioral: Negative.        Objective:   Physical Exam  Constitutional: He is oriented to person, place, and time. He appears well-developed and well-nourished.  Neck: No thyromegaly present.  Cardiovascular: Normal rate, regular rhythm, normal heart sounds and intact distal pulses.   Pulmonary/Chest: Effort normal and breath sounds normal.  Neurological: He is alert and oriented to person, place, and time.  Psychiatric: He has a normal mood and affect. His behavior is normal. Thought content normal.          Assessment & Plan:  Get labs including a testosterone level.

## 2014-04-25 NOTE — Progress Notes (Signed)
Pre visit review using our clinic review tool, if applicable. No additional management support is needed unless otherwise documented below in the visit note. 

## 2014-04-27 ENCOUNTER — Encounter: Payer: Self-pay | Admitting: Family Medicine

## 2014-04-28 ENCOUNTER — Telehealth: Payer: Self-pay | Admitting: Family Medicine

## 2014-04-28 MED ORDER — TESTOSTERONE 20.25 MG/1.25GM (1.62%) TD GEL: 4.0000 "application " | Freq: Every day | TRANSDERMAL | Status: DC

## 2014-04-28 NOTE — Telephone Encounter (Signed)
Request for Androgel was denied, insurance will cover Androderm, Axiron, Jule Ser.

## 2014-04-28 NOTE — Telephone Encounter (Signed)
Change to Axiron to apply one swipe under each arm daily, call in 6 month supply

## 2014-04-28 NOTE — Telephone Encounter (Signed)
Call in Androgel 1.62% pump to apply 4 pumps daily, give a 6 month supply and then we will check another level

## 2014-05-01 MED ORDER — TESTOSTERONE 30 MG/ACT TD SOLN: TRANSDERMAL | Status: DC

## 2014-05-01 NOTE — Telephone Encounter (Signed)
I called in script 

## 2014-05-02 ENCOUNTER — Telehealth: Payer: Self-pay | Admitting: Family Medicine

## 2014-05-02 NOTE — Telephone Encounter (Signed)
Request to change from [place] to [place], insurance will not cover Axiron. I spoke with pt and this will be the 3rd time changing medication. I had advised pt to contact insurance company to find out what is covered and I really don't understand what the confusion is?

## 2014-05-08 NOTE — Telephone Encounter (Signed)
Switch from Axiron to [place] 2% TD gel to apply 4 actuations daily. Call in 6 month supply

## 2014-05-09 MED ORDER — TESTOSTERONE 10 MG/ACT (2%) TD GEL: 4.0000 "application " | Freq: Every day | TRANSDERMAL | Status: DC

## 2014-05-09 NOTE — Telephone Encounter (Signed)
I called in new script and sent pt a message in my chart.

## 2014-05-16 ENCOUNTER — Encounter: Payer: Self-pay | Admitting: Family Medicine

## 2014-05-18 NOTE — Telephone Encounter (Signed)
See my other answer  

## 2014-05-18 NOTE — Telephone Encounter (Signed)
Actually no, the cyst would have no effect on his testosterone levels at all

## 2014-07-12 ENCOUNTER — Encounter: Payer: Self-pay | Admitting: Family Medicine

## 2014-07-12 MED ORDER — SYRINGE (DISPOSABLE) 3 ML MISC: 1.0000 mL | Status: DC

## 2014-07-12 MED ORDER — "NEEDLE (DISP) 25G X 1-1/2"" MISC": 1.0000 mL | Status: DC

## 2014-07-12 MED ORDER — TESTOSTERONE CYPIONATE 200 MG/ML IM SOLN: 200.0000 mg | INTRAMUSCULAR | Status: DC

## 2014-07-12 NOTE — Telephone Encounter (Signed)
We will switch to the shots. rx is ready

## 2014-09-12 ENCOUNTER — Encounter: Payer: Self-pay | Admitting: Family Medicine

## 2014-09-14 NOTE — Telephone Encounter (Signed)
I recommend a 6 month follow up, so we can check a level next month

## 2014-10-11 ENCOUNTER — Encounter: Payer: Self-pay | Admitting: Family Medicine

## 2014-10-11 ENCOUNTER — Ambulatory Visit (INDEPENDENT_AMBULATORY_CARE_PROVIDER_SITE_OTHER): Payer: BC Managed Care – PPO | Admitting: Family Medicine

## 2014-10-11 VITALS — BP 119/77 | HR 76 | Temp 98.1°F | Ht 70.5 in | Wt 180.0 lb

## 2014-10-11 DIAGNOSIS — N529 Male erectile dysfunction, unspecified: Secondary | ICD-10-CM

## 2014-10-11 DIAGNOSIS — E785 Hyperlipidemia, unspecified: Secondary | ICD-10-CM

## 2014-10-11 DIAGNOSIS — N528 Other male erectile dysfunction: Secondary | ICD-10-CM

## 2014-10-11 LAB — TESTOSTERONE: Testosterone: 600.67 ng/dL (ref 300.00–890.00)

## 2014-10-11 LAB — LIPID PANEL
CHOLESTEROL: 234 mg/dL — AB (ref 0–200)
HDL: 47.4 mg/dL (ref >39.00)
LDL Cholesterol: 174 mg/dL — ABNORMAL HIGH (ref 0–99)
NONHDL: 186.6
TRIGLYCERIDES: 63 mg/dL (ref 0.0–149.0)
Total CHOL/HDL Ratio: 5
VLDL: 12.6 mg/dL (ref 0.0–40.0)

## 2014-10-11 MED ORDER — TESTOSTERONE CYPIONATE 200 MG/ML IM SOLN: 200.0000 mg | INTRAMUSCULAR | Status: DC

## 2014-10-11 NOTE — Progress Notes (Signed)
   Subjective:    Patient ID: Derrick Medina, male    DOB: 1966/03/10, 48 y.o.   MRN: [id number]  HPI Here to follow up. He has been taking testosterone shots for 6 months, and he feels better. He has more energy and more strength. He is watching his diet and doing CrossFit every day.    Review of Systems  Constitutional: Negative.   Respiratory: Negative.   Cardiovascular: Negative.        Objective:   Physical Exam  Constitutional: He appears well-developed and well-nourished.  Cardiovascular: Normal rate, regular rhythm, normal heart sounds and intact distal pulses.   Pulmonary/Chest: Effort normal and breath sounds normal.          Assessment & Plan:  Get a testosterone level and a lipid panel.

## 2014-10-11 NOTE — Progress Notes (Signed)
Pre visit review using our clinic review tool, if applicable. No additional management support is needed unless otherwise documented below in the visit note. 

## 2014-10-19 ENCOUNTER — Other Ambulatory Visit: Payer: Self-pay | Admitting: Family Medicine

## 2014-10-19 MED ORDER — ATORVASTATIN CALCIUM 20 MG PO TABS: 20.0000 mg | ORAL_TABLET | Freq: Every day | ORAL | Status: DC

## 2014-10-19 NOTE — Telephone Encounter (Signed)
Denied.  Filled for 1 year on 01/10/14

## 2014-10-19 NOTE — Addendum Note (Signed)
Addended by: Aggie Hacker A on: 10/19/2014 09:42 AM   Modules accepted: Orders

## 2015-01-05 ENCOUNTER — Other Ambulatory Visit: Payer: Self-pay

## 2015-01-05 MED ORDER — ATORVASTATIN CALCIUM 20 MG PO TABS: 20.0000 mg | ORAL_TABLET | Freq: Every day | ORAL | Status: DC

## 2015-01-05 NOTE — Telephone Encounter (Signed)
Rx request for atorvastatin 20 mg tablet- Take 1 tablet by mouth daily #90  Pharm:  [facility]  Rx sent to pharmacy.

## 2015-01-16 ENCOUNTER — Other Ambulatory Visit: Payer: Self-pay | Admitting: Family Medicine

## 2015-01-16 ENCOUNTER — Encounter: Payer: Self-pay | Admitting: Family Medicine

## 2015-01-16 MED ORDER — ATORVASTATIN CALCIUM 20 MG PO TABS: 20.0000 mg | ORAL_TABLET | Freq: Every day | ORAL | Status: DC

## 2015-03-28 ENCOUNTER — Encounter: Payer: Self-pay | Admitting: Family Medicine

## 2015-04-01 NOTE — Telephone Encounter (Signed)
Have him come in (fasting) for lipids and a hepatic panel soon

## 2015-04-02 ENCOUNTER — Other Ambulatory Visit: Payer: Self-pay | Admitting: Family Medicine

## 2015-04-02 DIAGNOSIS — E785 Hyperlipidemia, unspecified: Secondary | ICD-10-CM

## 2015-04-10 ENCOUNTER — Other Ambulatory Visit: Payer: Self-pay | Admitting: Family Medicine

## 2015-04-10 DIAGNOSIS — E890 Postprocedural hypothyroidism: Secondary | ICD-10-CM

## 2015-04-10 MED ORDER — LEVOTHYROXINE SODIUM 137 MCG PO TABS: 137.0000 ug | ORAL_TABLET | Freq: Every day | ORAL | Status: DC

## 2015-04-10 MED ORDER — ATORVASTATIN CALCIUM 20 MG PO TABS: 20.0000 mg | ORAL_TABLET | Freq: Every day | ORAL | Status: DC

## 2015-04-10 NOTE — Telephone Encounter (Signed)
Per Dr. Sarajane Jews okay to refill scripts and order a TSH code is E 89.0. I sent both scripts e-scribe, put future lab order in computer and spoke with pt.

## 2015-04-10 NOTE — Addendum Note (Signed)
Addended by: Aggie Hacker A on: 04/10/2015 01:47 PM   Modules accepted: Orders

## 2015-04-20 ENCOUNTER — Encounter: Payer: Self-pay | Admitting: Family Medicine

## 2015-04-20 MED ORDER — SUMATRIPTAN SUCCINATE 100 MG PO TABS: 100.0000 mg | ORAL_TABLET | ORAL | Status: DC | PRN

## 2015-05-23 ENCOUNTER — Other Ambulatory Visit (INDEPENDENT_AMBULATORY_CARE_PROVIDER_SITE_OTHER): Payer: BLUE CROSS/BLUE SHIELD

## 2015-05-23 DIAGNOSIS — E890 Postprocedural hypothyroidism: Secondary | ICD-10-CM | POA: Diagnosis not present

## 2015-05-23 DIAGNOSIS — E785 Hyperlipidemia, unspecified: Secondary | ICD-10-CM | POA: Diagnosis not present

## 2015-05-23 LAB — HEPATIC FUNCTION PANEL
ALBUMIN: 4.3 g/dL (ref 3.5–5.2)
ALT: 17 U/L (ref 0–53)
AST: 19 U/L (ref 0–37)
Alkaline Phosphatase: 48 U/L (ref 39–117)
Bilirubin, Direct: 0.4 mg/dL — ABNORMAL HIGH (ref 0.0–0.3)
TOTAL PROTEIN: 6.7 g/dL (ref 6.0–8.3)
Total Bilirubin: 2.2 mg/dL — ABNORMAL HIGH (ref 0.2–1.2)

## 2015-05-23 LAB — TSH: TSH: 3.15 u[IU]/mL (ref 0.35–4.50)

## 2015-05-23 LAB — LIPID PANEL
Cholesterol: 139 mg/dL (ref 0–200)
HDL: 55.1 mg/dL (ref >39.00)
LDL Cholesterol: 74 mg/dL (ref 0–99)
NONHDL: 83.9
Total CHOL/HDL Ratio: 3
Triglycerides: 49 mg/dL (ref 0.0–149.0)
VLDL: 9.8 mg/dL (ref 0.0–40.0)

## 2015-05-24 ENCOUNTER — Encounter: Payer: Self-pay | Admitting: Family Medicine

## 2015-05-24 NOTE — Telephone Encounter (Signed)
Tell him to stop taking the Lipitor for 90 days, then we will recheck a lipid panel in 90 days

## 2015-06-11 ENCOUNTER — Encounter: Payer: Self-pay | Admitting: Family Medicine

## 2015-06-11 ENCOUNTER — Ambulatory Visit (INDEPENDENT_AMBULATORY_CARE_PROVIDER_SITE_OTHER): Payer: BLUE CROSS/BLUE SHIELD | Admitting: Family Medicine

## 2015-06-11 VITALS — BP 125/79 | HR 68 | Temp 98.6°F | Ht 70.5 in | Wt 180.0 lb

## 2015-06-11 DIAGNOSIS — M5431 Sciatica, right side: Secondary | ICD-10-CM

## 2015-06-11 MED ORDER — DICLOFENAC SODIUM 75 MG PO TBEC: 75.0000 mg | DELAYED_RELEASE_TABLET | Freq: Two times a day (BID) | ORAL | Status: DC

## 2015-06-11 NOTE — Progress Notes (Signed)
Pre visit review using our clinic review tool, if applicable. No additional management support is needed unless otherwise documented below in the visit note. 

## 2015-06-11 NOTE — Progress Notes (Signed)
   Subjective:    Patient ID: Derrick Medina, male    DOB: September 22, 1966, 49 y.o.   MRN: [id number]  HPI Here for 6 months of intermittent pain in the right buttock and thigh, numbness in the right foot, and weakness in the right leg. No back pain per se. No hx of trauma. He works out hard in [employer] with [employer] and he participates in a [employer]. Part of this workout involves doing squats with heavy weights, and he says that the squats bother him more than anything else. He has taken nothing for the pain but has simply tried to work through it.    Review of Systems  Musculoskeletal: Positive for myalgias and arthralgias. Negative for back pain, joint swelling and gait problem.  Neurological: Positive for weakness and numbness.       Objective:   Physical Exam  Constitutional: He is oriented to person, place, and time. He appears well-developed and well-nourished. No distress.  Cardiovascular: Normal rate, regular rhythm, normal heart sounds and intact distal pulses.   Pulmonary/Chest: Effort normal and breath sounds normal.  Musculoskeletal:  His spine is normal with full ROM. He is not tender over the sciatic notches. SLR are negative.   Neurological: He is alert and oriented to person, place, and time. He has normal reflexes. No cranial nerve deficit. He exhibits normal muscle tone. Coordination normal.          Assessment & Plan:  He appears to have some sciatica so my first advice was to ease up on the workouts and definitely to stop doing squats. Try Diclofenac bid to reduce inflammation. Do stretches for the lower back and hamstrings. Recheck prn

## 2015-07-08 ENCOUNTER — Other Ambulatory Visit: Payer: Self-pay | Admitting: Family Medicine

## 2015-08-22 ENCOUNTER — Encounter: Payer: Self-pay | Admitting: Family Medicine

## 2015-08-23 NOTE — Telephone Encounter (Signed)
Refill these for 6 months 

## 2015-08-24 MED ORDER — TESTOSTERONE CYPIONATE 200 MG/ML IM SOLN: 200.0000 mg | INTRAMUSCULAR | Status: DC

## 2015-08-24 MED ORDER — "NEEDLE (DISP) 25G X 1-1/2"" MISC": 1.0000 mL | Status: DC

## 2015-10-11 ENCOUNTER — Other Ambulatory Visit: Payer: Self-pay | Admitting: Family Medicine

## 2015-10-11 MED ORDER — LEVOTHYROXINE SODIUM 137 MCG PO TABS: ORAL_TABLET | ORAL | Status: DC

## 2015-12-25 ENCOUNTER — Other Ambulatory Visit: Payer: Self-pay | Admitting: Family Medicine

## 2015-12-25 MED ORDER — LEVOTHYROXINE SODIUM 137 MCG PO TABS: ORAL_TABLET | ORAL | Status: DC

## 2015-12-25 NOTE — Addendum Note (Signed)
Addended by: Aggie Hacker A on: 12/25/2015 02:45 PM   Modules accepted: Orders

## 2016-01-11 ENCOUNTER — Other Ambulatory Visit: Payer: Self-pay | Admitting: Family Medicine

## 2016-01-11 ENCOUNTER — Other Ambulatory Visit: Payer: Self-pay

## 2016-01-11 ENCOUNTER — Encounter: Payer: Self-pay | Admitting: Family Medicine

## 2016-01-11 NOTE — Telephone Encounter (Signed)
Refill for 6 months. 

## 2016-01-11 NOTE — Telephone Encounter (Signed)
Patent has requested refill on Testosterone. Ok to refill?

## 2016-01-11 NOTE — Telephone Encounter (Signed)
done

## 2016-01-11 NOTE — Telephone Encounter (Signed)
Looks like pt might need a refill.

## 2016-01-14 MED ORDER — TESTOSTERONE CYPIONATE 200 MG/ML IM SOLN: 200.0000 mg | INTRAMUSCULAR | Status: DC

## 2016-01-17 ENCOUNTER — Other Ambulatory Visit: Payer: Self-pay | Admitting: Family Medicine

## 2016-01-17 ENCOUNTER — Encounter: Payer: Self-pay | Admitting: Family Medicine

## 2016-03-19 ENCOUNTER — Other Ambulatory Visit: Payer: Self-pay | Admitting: Family Medicine

## 2016-03-19 MED ORDER — SUMATRIPTAN SUCCINATE 100 MG PO TABS: 100.0000 mg | ORAL_TABLET | ORAL | Status: DC | PRN

## 2016-04-01 ENCOUNTER — Encounter: Payer: Self-pay | Admitting: Family Medicine

## 2016-04-01 ENCOUNTER — Ambulatory Visit (INDEPENDENT_AMBULATORY_CARE_PROVIDER_SITE_OTHER): Payer: BLUE CROSS/BLUE SHIELD | Admitting: Family Medicine

## 2016-04-01 VITALS — BP 127/83 | HR 57 | Temp 97.8°F | Ht 70.5 in | Wt 178.0 lb

## 2016-04-01 DIAGNOSIS — E785 Hyperlipidemia, unspecified: Secondary | ICD-10-CM | POA: Diagnosis not present

## 2016-04-01 DIAGNOSIS — N529 Male erectile dysfunction, unspecified: Secondary | ICD-10-CM

## 2016-04-01 DIAGNOSIS — G43909 Migraine, unspecified, not intractable, without status migrainosus: Secondary | ICD-10-CM

## 2016-04-01 LAB — HEPATIC FUNCTION PANEL
ALK PHOS: 36 U/L — AB (ref 39–117)
ALT: 17 U/L (ref 0–53)
AST: 21 U/L (ref 0–37)
Albumin: 4.9 g/dL (ref 3.5–5.2)
BILIRUBIN DIRECT: 0.3 mg/dL (ref 0.0–0.3)
BILIRUBIN TOTAL: 2.1 mg/dL — AB (ref 0.2–1.2)
Total Protein: 6.7 g/dL (ref 6.0–8.3)

## 2016-04-01 LAB — TSH: TSH: 3.7 u[IU]/mL (ref 0.35–4.50)

## 2016-04-01 LAB — BASIC METABOLIC PANEL
BUN: 15 mg/dL (ref 6–23)
CHLORIDE: 103 meq/L (ref 96–112)
CO2: 30 mEq/L (ref 19–32)
Calcium: 9.7 mg/dL (ref 8.4–10.5)
Creatinine, Ser: 1.08 mg/dL (ref 0.40–1.50)
GFR: 77.66 mL/min (ref >60.00)
Glucose, Bld: 84 mg/dL (ref 70–99)
POTASSIUM: 4.8 meq/L (ref 3.5–5.1)
SODIUM: 141 meq/L (ref 135–145)

## 2016-04-01 LAB — LIPID PANEL
CHOL/HDL RATIO: 5
Cholesterol: 241 mg/dL — ABNORMAL HIGH (ref 0–200)
HDL: 45.4 mg/dL (ref >39.00)
LDL CALC: 174 mg/dL — AB (ref 0–99)
NonHDL: 195.51
Triglycerides: 108 mg/dL (ref 0.0–149.0)
VLDL: 21.6 mg/dL (ref 0.0–40.0)

## 2016-04-01 LAB — CBC WITH DIFFERENTIAL/PLATELET
BASOS PCT: 0.4 % (ref 0.0–3.0)
Basophils Absolute: 0 10*3/uL (ref 0.0–0.1)
EOS PCT: 2 % (ref 0.0–5.0)
Eosinophils Absolute: 0.1 10*3/uL (ref 0.0–0.7)
HCT: 51 % (ref 39.0–52.0)
Hemoglobin: 17.2 g/dL — ABNORMAL HIGH (ref 13.0–17.0)
LYMPHS ABS: 2.2 10*3/uL (ref 0.7–4.0)
Lymphocytes Relative: 34.2 % (ref 12.0–46.0)
MCHC: 33.8 g/dL (ref 30.0–36.0)
MCV: 89.4 fl (ref 78.0–100.0)
MONOS PCT: 9.4 % (ref 3.0–12.0)
Monocytes Absolute: 0.6 10*3/uL (ref 0.1–1.0)
NEUTROS ABS: 3.4 10*3/uL (ref 1.4–7.7)
NEUTROS PCT: 54 % (ref 43.0–77.0)
PLATELETS: 201 10*3/uL (ref 150.0–400.0)
RBC: 5.7 Mil/uL (ref 4.22–5.81)
RDW: 13.8 % (ref 11.5–15.5)
WBC: 6.3 10*3/uL (ref 4.0–10.5)

## 2016-04-01 LAB — TESTOSTERONE: Testosterone: 190.82 ng/dL — ABNORMAL LOW (ref 300.00–890.00)

## 2016-04-01 MED ORDER — TOPIRAMATE 25 MG PO TABS: 25.0000 mg | ORAL_TABLET | Freq: Every day | ORAL | Status: DC

## 2016-04-01 MED ORDER — SUMATRIPTAN SUCCINATE 100 MG PO TABS: 100.0000 mg | ORAL_TABLET | ORAL | Status: DC | PRN

## 2016-04-01 NOTE — Progress Notes (Signed)
   Subjective:    Patient ID: Derrick Medina, male    DOB: 1966-07-11, 50 y.o.   MRN: [id number]  HPI Here to discuss migraines. These have become much more frequent lately and he sometimes has one every day. He still gets fairly good relief with Sumatriptan. He is also fasting for labs today.    Review of Systems  Constitutional: Negative.   Respiratory: Negative.   Cardiovascular: Negative.   Neurological: Positive for headaches.       Objective:   Physical Exam  Constitutional: He is oriented to person, place, and time. He appears well-developed and well-nourished.  Cardiovascular: Normal rate, regular rhythm, normal heart sounds and intact distal pulses.   Pulmonary/Chest: Effort normal and breath sounds normal.  Neurological: He is alert and oriented to person, place, and time. No cranial nerve deficit. He exhibits normal muscle tone. Coordination normal.          Assessment & Plan:  His migraines are more frequent so we will start on daily Topamax for prevention. Start at 25 mg at bedtime. He will let us know how he is doing in one week, since we have the option of titrating up on the dose if needed.  Laurey Morale, MD

## 2016-04-01 NOTE — Progress Notes (Signed)
Pre visit review using our clinic review tool, if applicable. No additional management support is needed unless otherwise documented below in the visit note. 

## 2016-06-02 ENCOUNTER — Other Ambulatory Visit: Payer: Self-pay | Admitting: Family Medicine

## 2016-06-02 NOTE — Telephone Encounter (Signed)
Refill for 6 months. 

## 2016-06-03 MED ORDER — TESTOSTERONE CYPIONATE 200 MG/ML IM SOLN: 200.0000 mg | INTRAMUSCULAR | 1 refills | Status: DC

## 2016-06-03 MED ORDER — "NEEDLE (DISP) 25G X 1-1/2"" MISC": 1.0000 mL | 1 refills | Status: DC

## 2016-06-03 MED ORDER — SYRINGE (DISPOSABLE) 3 ML MISC: 1.0000 mL | 0 refills | Status: DC

## 2016-06-19 ENCOUNTER — Other Ambulatory Visit: Payer: Self-pay | Admitting: Family Medicine

## 2016-06-19 ENCOUNTER — Encounter: Payer: Self-pay | Admitting: Family Medicine

## 2016-09-18 ENCOUNTER — Ambulatory Visit (INDEPENDENT_AMBULATORY_CARE_PROVIDER_SITE_OTHER): Payer: BLUE CROSS/BLUE SHIELD | Admitting: Family Medicine

## 2016-09-18 ENCOUNTER — Encounter: Payer: Self-pay | Admitting: Family Medicine

## 2016-09-18 VITALS — BP 131/86 | HR 56 | Temp 97.9°F | Ht 70.5 in | Wt 181.0 lb

## 2016-09-18 DIAGNOSIS — Z23 Encounter for immunization: Secondary | ICD-10-CM

## 2016-09-18 DIAGNOSIS — G629 Polyneuropathy, unspecified: Secondary | ICD-10-CM | POA: Diagnosis not present

## 2016-09-18 LAB — HEPATIC FUNCTION PANEL
ALK PHOS: 49 U/L (ref 39–117)
ALT: 17 U/L (ref 0–53)
AST: 16 U/L (ref 0–37)
Albumin: 4.6 g/dL (ref 3.5–5.2)
BILIRUBIN DIRECT: 0.2 mg/dL (ref 0.0–0.3)
BILIRUBIN TOTAL: 0.9 mg/dL (ref 0.2–1.2)
Total Protein: 7 g/dL (ref 6.0–8.3)

## 2016-09-18 LAB — CBC WITH DIFFERENTIAL/PLATELET
BASOS ABS: 0 10*3/uL (ref 0.0–0.1)
BASOS PCT: 0.4 % (ref 0.0–3.0)
EOS PCT: 2.3 % (ref 0.0–5.0)
Eosinophils Absolute: 0.1 10*3/uL (ref 0.0–0.7)
HEMATOCRIT: 49.9 % (ref 39.0–52.0)
Hemoglobin: 16.5 g/dL (ref 13.0–17.0)
LYMPHS ABS: 2 10*3/uL (ref 0.7–4.0)
LYMPHS PCT: 32.5 % (ref 12.0–46.0)
MCHC: 33.2 g/dL (ref 30.0–36.0)
MCV: 91.3 fl (ref 78.0–100.0)
MONOS PCT: 11.5 % (ref 3.0–12.0)
Monocytes Absolute: 0.7 10*3/uL (ref 0.1–1.0)
NEUTROS ABS: 3.3 10*3/uL (ref 1.4–7.7)
NEUTROS PCT: 53.3 % (ref 43.0–77.0)
PLATELETS: 199 10*3/uL (ref 150.0–400.0)
RBC: 5.46 Mil/uL (ref 4.22–5.81)
RDW: 13.6 % (ref 11.5–15.5)
WBC: 6.2 10*3/uL (ref 4.0–10.5)

## 2016-09-18 LAB — TSH: TSH: 10.25 u[IU]/mL — ABNORMAL HIGH (ref 0.35–4.50)

## 2016-09-18 LAB — BASIC METABOLIC PANEL
BUN: 15 mg/dL (ref 6–23)
CALCIUM: 9.7 mg/dL (ref 8.4–10.5)
CO2: 32 mEq/L (ref 19–32)
Chloride: 104 mEq/L (ref 96–112)
Creatinine, Ser: 1.12 mg/dL (ref 0.40–1.50)
GFR: 74.32 mL/min (ref >60.00)
GLUCOSE: 79 mg/dL (ref 70–99)
POTASSIUM: 4.7 meq/L (ref 3.5–5.1)
SODIUM: 142 meq/L (ref 135–145)

## 2016-09-18 LAB — T3, FREE: T3 FREE: 2.7 pg/mL (ref 2.3–4.2)

## 2016-09-18 LAB — MAGNESIUM: Magnesium: 2.1 mg/dL (ref 1.5–2.5)

## 2016-09-18 LAB — VITAMIN B12: Vitamin B-12: 710 pg/mL (ref 211–911)

## 2016-09-18 LAB — T4, FREE: Free T4: 1.13 ng/dL (ref 0.60–1.60)

## 2016-09-18 LAB — VITAMIN D 25 HYDROXY (VIT D DEFICIENCY, FRACTURES): VITD: 40.88 ng/mL (ref 30.00–100.00)

## 2016-09-18 NOTE — Progress Notes (Signed)
   Subjective:    Patient ID: Derrick Medina, male    DOB: 27-Jun-1966, 50 y.o.   MRN: [id number]  HPI Here for intermittent feelings over his entire body of itching or tingling or burning. These started about 3 months ago. They can be quite predictable because they start when his body gets hot. They are more pronounced when he is working out at [employer]. About 5-10 minutes into his workout the sensations start and they last about 10 minutes before fading away. No vision changes. No other symptoms.    Review of Systems  Constitutional: Negative.   Respiratory: Negative.   Cardiovascular: Negative.   Gastrointestinal: Negative.   Endocrine: Negative.   Neurological: Negative.        Objective:   Physical Exam  Constitutional: He is oriented to person, place, and time. He appears well-developed and well-nourished.  Cardiovascular: Normal rate, regular rhythm, normal heart sounds and intact distal pulses.   Pulmonary/Chest: Effort normal and breath sounds normal.  Neurological: He is alert and oriented to person, place, and time. No cranial nerve deficit. He exhibits normal muscle tone. Coordination normal.          Assessment & Plan:  Neuropathy of uncertain etiology. Get labs to investigate  Laurey Morale, MD

## 2016-09-18 NOTE — Progress Notes (Signed)
Pre visit review using our clinic review tool, if applicable. No additional management support is needed unless otherwise documented below in the visit note. 

## 2016-09-25 ENCOUNTER — Encounter: Payer: Self-pay | Admitting: Family Medicine

## 2016-09-25 DIAGNOSIS — G629 Polyneuropathy, unspecified: Secondary | ICD-10-CM

## 2016-09-25 NOTE — Telephone Encounter (Signed)
The referral to Neurology is done. The TSH was elevated but I think this was an error (we have seen a lot of these lately) because his free T3 and T4 were both normal.

## 2016-09-29 ENCOUNTER — Encounter: Payer: Self-pay | Admitting: Gastroenterology

## 2016-11-13 ENCOUNTER — Encounter: Payer: Self-pay | Admitting: Neurology

## 2016-11-13 ENCOUNTER — Ambulatory Visit (INDEPENDENT_AMBULATORY_CARE_PROVIDER_SITE_OTHER): Payer: BLUE CROSS/BLUE SHIELD | Admitting: Neurology

## 2016-11-13 ENCOUNTER — Other Ambulatory Visit (INDEPENDENT_AMBULATORY_CARE_PROVIDER_SITE_OTHER): Payer: BLUE CROSS/BLUE SHIELD

## 2016-11-13 VITALS — BP 140/90 | HR 62 | Ht 70.5 in | Wt 181.4 lb

## 2016-11-13 DIAGNOSIS — R292 Abnormal reflex: Secondary | ICD-10-CM | POA: Diagnosis not present

## 2016-11-13 DIAGNOSIS — R202 Paresthesia of skin: Secondary | ICD-10-CM

## 2016-11-13 DIAGNOSIS — R209 Unspecified disturbances of skin sensation: Secondary | ICD-10-CM | POA: Diagnosis not present

## 2016-11-13 DIAGNOSIS — G43009 Migraine without aura, not intractable, without status migrainosus: Secondary | ICD-10-CM

## 2016-11-13 LAB — SEDIMENTATION RATE: SED RATE: 5 mm/h (ref 0–15)

## 2016-11-13 MED ORDER — GABAPENTIN 100 MG PO CAPS: ORAL_CAPSULE | ORAL | 3 refills | Status: DC

## 2016-11-13 NOTE — Progress Notes (Signed)
[facility] Clinic Note - Initial Visit   Date: 11/13/16  Derrick Medina MRN: [id number] DOB: Apr 07, 1966   Dear Dr. Sarajane Jews:  Thank you for your kind referral of Derrick Medina for consultation of paresthesias. Although his history is well known to you, please allow [place] to reiterate it for the purpose of our medical record. The patient was accompanied to the clinic by self.   History of Present Illness: Derrick Medina is a 52 y.o. right-handed Caucasian male with hyperthyroidism s/p radioactive ablation now with hypothyroidism and migraines presenting for evaluation of disturbance of skin sensation.    Starting in the summer of 2017, he began experiencing burning, stinging sensation with any type of physical exertion.  It first starts as an itching sensation, then becomes jabbing sttarting in his hands and feet, and then involves the whole-body (chest and face).  He is very active and used to go to the gym daily to lift weights. However because exertion exacerbates symptoms, he reduced his workouts to 4 days per week and found that if he runs on a treadmill for 15 minutes at a rate of 7-61mh, the discomfort is not as intense and he is able to work-out without the discomfort.  If he were to start lifting immediately, then the paresthesias are very severe and sometimes he will stop his work-out early.  Besides physical exertion, he has noticed that warmer temperatures and increased body temperature trigger the paresthesias. Emotional stressors can do this to a lesser degree. He has noticed that he does not sweat on his limbs, even with working out.  He denies any dry mouth, dry eyes, early satiety.  He endorses some mild erectile dysfunction.  He also has a history of episodic migraines, controlled on Imitrex as needed. His headaches are infrequent and occur about once a year, however they can cluster in 6 week period. He was tried on topiramate for a brief time, however  did not tolerate the medication due to cognitive side effects and this was discontinued.  Out-side paper records, electronic medical record, and images have been reviewed where available and summarized as:  Lab Results  Component Value Date   TSH 10.25 (H) 09/18/2016   Lab Results  Component Value Date   [id number] 09/18/2016     Past Medical History:  Diagnosis Date  . Heart murmur   . Hyperlipidemia   . Hyperthyroidism 11/26/2011   sees Dr. ELoanne Drilling had radioactive ablation on 01-30-12  . Migraines     History reviewed. No pertinent surgical history.   Medications:  Outpatient Encounter Prescriptions as of 11/13/2016  Medication Sig  . levothyroxine (SYNTHROID, LEVOTHROID) 137 MCG tablet TAKE 1 TABLET (137 MCG TOTAL) BY MOUTH DAILY BEFORE BREAKFAST.  .Marland KitchenNEEDLE, DISP, 25 G 25G X 1-1/2" MISC 1 mL by Does not apply route every 14 (fourteen) days.  . SUMAtriptan (IMITREX) 100 MG tablet Take 1 tablet (100 mg total) by mouth as needed for migraine.  . Syringe, Disposable, 3 ML MISC 1 mL by Does not apply route every 14 (fourteen) days.  .Marland Kitchentestosterone cypionate (DEPOTESTOSTERONE CYPIONATE) 200 MG/ML injection Inject 1 mL (200 mg total) into the muscle every 14 (fourteen) days.  .Marland Kitchengabapentin (NEURONTIN) 100 MG capsule Take 1-2 tablet 1 hour before workouts.  . [DISCONTINUED] topiramate (TOPAMAX) 25 MG tablet Take 1 tablet (25 mg total) by mouth at bedtime.   No facility-administered encounter medications on file as of 11/13/2016.      Allergies:  No Known Allergies  Family History: Family History  Problem Relation Age of Onset  . Hyperlipidemia      family hx  . Hyperlipidemia Mother   . Hypertension Mother   . Hyperthyroidism Mother   . Hyperlipidemia Father     Social History: Social History  Substance Use Topics  . Smoking status: Former Research scientist (life sciences)  . Smokeless tobacco: Never Used  . Alcohol use 1.8 oz/week    3 Standard drinks or equivalent per week     Comment: 2  glasses wine/week   Social History   Social History Narrative   Lives with wife and 2 children in a 2 story home.     Works as a Freight forwarder for a Social worker.     Education: college.    Review of Systems:  CONSTITUTIONAL: No fevers, chills, night sweats, or weight loss.   EYES: No visual changes or eye pain ENT: No hearing changes.  No history of nose bleeds.   RESPIRATORY: No cough, wheezing and shortness of breath.   CARDIOVASCULAR: Negative for chest pain, and palpitations.   GI: Negative for abdominal discomfort, blood in stools or black stools.  No recent change in bowel habits.   GU:  No history of incontinence.   MUSCLOSKELETAL: No history of joint pain or swelling.  No myalgias.   SKIN: Negative for lesions, rash, and itching.   HEMATOLOGY/ONCOLOGY: Negative for prolonged bleeding, bruising easily, and swollen nodes.  No history of cancer.   ENDOCRINE: Negative for cold or heat intolerance, polydipsia or goiter.   PSYCH:  No depression or anxiety symptoms.   NEURO: As Above.   Vital Signs:  BP 140/90   Pulse 62   Ht 5' 10.5" (1.791 m)   Wt 181 lb 7 oz (82.3 kg)   SpO2 99%   BMI 25.67 kg/m  Pain Scale: 0 on a scale of 0-10   General Medical Exam:   General:  Well appearing, comfortable.   Eyes/ENT: see cranial nerve examination.   Neck: No masses appreciated.  Full range of motion without tenderness.  No carotid bruits. Respiratory:  Clear to auscultation, good air entry bilaterally.   Cardiac:  Regular rate and rhythm, no murmur.   Extremities:  No deformities, edema, or skin discoloration.  Skin:  No rashes or lesions.  Neurological Exam: MENTAL STATUS including orientation to time, place, person, recent and remote memory, attention span and concentration, language, and fund of knowledge is normal.  Speech is not dysarthric.  CRANIAL NERVES: II:  No visual field defects.  Unremarkable fundi.   III-IV-VI: Pupils equal round and reactive to  light.  Normal conjugate, extra-ocular eye movements in all directions of gaze.  No nystagmus.  No ptosis.   V:  Normal facial sensation.   VII:  Normal facial symmetry and movements.  VIII:  Normal hearing and vestibular function.   IX-X:  Normal palatal movement.   XI:  Normal shoulder shrug and head rotation.   XII:  Normal tongue strength and range of motion, no deviation or fasciculation.  MOTOR:  No atrophy, fasciculations or abnormal movements.  No pronator drift.  Tone is normal.    Right Upper Extremity:    Left Upper Extremity:    Deltoid  5/5   Deltoid  5/5   Biceps  5/5   Biceps  5/5   Triceps  5/5   Triceps  5/5   Wrist extensors  5/5   Wrist extensors  5/5   Wrist flexors  5/5   Wrist flexors  5/5   Finger extensors  5/5   Finger extensors  5/5   Finger flexors  5/5   Finger flexors  5/5   Dorsal interossei  5/5   Dorsal interossei  5/5   Abductor pollicis  5/5   Abductor pollicis  5/5   Tone (Ashworth scale)  0  Tone (Ashworth scale)  0   Right Lower Extremity:    Left Lower Extremity:    Hip flexors  5/5   Hip flexors  5/5   Hip extensors  5/5   Hip extensors  5/5   Knee flexors  5/5   Knee flexors  5/5   Knee extensors  5/5   Knee extensors  5/5   Dorsiflexors  5/5   Dorsiflexors  5/5   Plantarflexors  5/5   Plantarflexors  5/5   Toe extensors  5/5   Toe extensors  5/5   Toe flexors  5/5   Toe flexors  5/5   Tone (Ashworth scale)  0  Tone (Ashworth scale)  0   MSRs:  Right                                                                 Left brachioradialis 2+  brachioradialis 2+  biceps 2+  biceps 2+  triceps 2+  triceps 2+  patellar 3+  patellar 3+  ankle jerk 2+  ankle jerk 2+  Hoffman no  Hoffman no  plantar response down  plantar response down   SENSORY:  Normal and symmetric perception of light touch, pinprick, vibration, and proprioception.  Romberg's sign absent.   COORDINATION/GAIT: Normal finger-to- nose-finger and heel-to-shin.  Intact rapid  alternating movements bilaterally.  Able to rise from a chair without using arms.  Gait narrow based and stable. Tandem and stressed gait intact.    IMPRESSION: Mr. Haugan is a pleasant 51 year-old gentleman referred for evaluation of dysesthesias involving the whole body following physical exertion. His neurological exam is remarkably intact. He does have brisk reflexes at the patella jerks bilaterally, but there are no other upper motor neuron findings. Symptoms are very suggestive of what is seen in Uhthoff's phenomenon, where overheating can manifest with abnormal sensory symptoms as well as other neurological conditions; however, this phenomenon is very specific to multiple sclerosis.  He denies any history of vision changes, lateralizing weakness, or lateralizing paresthesias.  Because of his presentation and hyperreflexia of the legs, I do want to evaluate him for demyelinating disease. MRI brain with and without contrast.  I will also screen him for nutrition and autoimmune diseases which can cause abnormal skin sensation.  PLAN/RECOMMENDATIONS:  1.  Check ESR, copper, vitamin B1, SPEP with IFE, SSA/B 2.  MRI brain wwo contrast 3.  For symptomatic management, trial of gabapentin will be offered. He can start with gabapentin 100 mg 1 hour prior to exercising and increase to 300 mg, if needed.  4.  If imaging is normal, the next step will be NCS/EMG of the right arm and leg.   The duration of this appointment visit was 50 minutes of face-to-face time with the patient.  Greater than 50% of this time was spent in counseling, explanation of diagnosis, planning of further management, and coordination of  care.   Thank you for allowing me to participate in patient's care.  If I can answer any additional questions, I would be pleased to do so.    Sincerely,    Tenna Lacko K. Posey Pronto, DO

## 2016-11-13 NOTE — Patient Instructions (Addendum)
1.  MRI brain wwo contrast 2.  Start gabapentin 100mg  prior to workout. You can increase this to 2 tablets, if needed. 3.  Check blood work 4.  We may consider additional nerve testing of the arm and leg, if your imaging is normal  We will call you with the results and let you know the next step

## 2016-11-14 LAB — SJOGREN'S SYNDROME ANTIBODS(SSA + SSB)
SSA (Ro) (ENA) Antibody, IgG: <1
SSB (La) (ENA) Antibody, IgG: <1

## 2016-11-15 LAB — COPPER, SERUM: COPPER: 96 ug/dL (ref 70–175)

## 2016-11-17 LAB — PROTEIN ELECTROPHORESIS, SERUM
ALPHA-1-GLOBULIN: 0.2 g/dL (ref 0.2–0.3)
ALPHA-2-GLOBULIN: 0.6 g/dL (ref 0.5–0.9)
Albumin ELP: 5 g/dL — ABNORMAL HIGH (ref 3.8–4.8)
Beta 2: 0.3 g/dL (ref 0.2–0.5)
Beta Globulin: 0.4 g/dL (ref 0.4–0.6)
GAMMA GLOBULIN: 0.8 g/dL (ref 0.8–1.7)
Total Protein, Serum Electrophoresis: 7.4 g/dL (ref 6.1–8.1)

## 2016-11-17 LAB — IMMUNOFIXATION ELECTROPHORESIS
IGA: 227 mg/dL (ref 81–463)
IgG (Immunoglobin G), Serum: 942 mg/dL (ref 694–1618)
IgM, Serum: 37 mg/dL — ABNORMAL LOW (ref 48–271)

## 2016-11-17 LAB — VITAMIN B1: VITAMIN B1 (THIAMINE): 10 nmol/L (ref 8–30)

## 2016-11-28 ENCOUNTER — Ambulatory Visit
Admission: RE | Admit: 2016-11-28 | Discharge: 2016-11-28 | Disposition: A | Discharge status: Home / Self Care | Payer: BLUE CROSS/BLUE SHIELD | Source: Ambulatory Visit | Attending: Neurology | Admitting: Neurology

## 2016-11-28 DIAGNOSIS — R202 Paresthesia of skin: Secondary | ICD-10-CM

## 2016-11-28 DIAGNOSIS — G43909 Migraine, unspecified, not intractable, without status migrainosus: Secondary | ICD-10-CM | POA: Diagnosis not present

## 2016-11-28 DIAGNOSIS — R292 Abnormal reflex: Secondary | ICD-10-CM

## 2016-11-28 DIAGNOSIS — R209 Unspecified disturbances of skin sensation: Principal | ICD-10-CM

## 2016-11-28 MED ORDER — GADOBENATE DIMEGLUMINE 529 MG/ML IV SOLN
15.0000 mL | Freq: Once | INTRAVENOUS | Status: AC | PRN
  Administered 2016-11-28: 15 mL via INTRAVENOUS

## 2016-12-01 ENCOUNTER — Telehealth: Payer: Self-pay | Admitting: *Deleted

## 2016-12-01 ENCOUNTER — Encounter: Payer: Self-pay | Admitting: *Deleted

## 2016-12-01 NOTE — Telephone Encounter (Signed)
-----   Message from Alda Berthold, DO sent at 11/28/2016  3:19 PM EST ----- Please inform patient that his MRI brain is normal.  If he is agreeable, please order NCS/EMG of the right arm and leg as the next step.  Thanks.

## 2016-12-01 NOTE — Telephone Encounter (Signed)
Gave patient results via My Chart.  Requested for him to let me know if he would like to proceed with the nerve conduction test.

## 2016-12-12 ENCOUNTER — Ambulatory Visit: Payer: BLUE CROSS/BLUE SHIELD | Admitting: Neurology

## 2016-12-12 ENCOUNTER — Other Ambulatory Visit: Payer: Self-pay | Admitting: Family Medicine

## 2016-12-12 ENCOUNTER — Encounter: Payer: Self-pay | Admitting: Family Medicine

## 2017-03-04 ENCOUNTER — Other Ambulatory Visit: Payer: Self-pay | Admitting: Family Medicine

## 2017-03-04 MED ORDER — TESTOSTERONE CYPIONATE 200 MG/ML IM SOLN: 200.0000 mg | INTRAMUSCULAR | 0 refills | Status: DC

## 2017-03-04 MED ORDER — SYRINGE (DISPOSABLE) 3 ML MISC: 1.0000 mL | 5 refills | Status: DC

## 2017-03-04 NOTE — Telephone Encounter (Signed)
Per Dr. Sarajane Jews okay to refill the Testosterone and I did print and fax to [facility].

## 2017-03-04 NOTE — Addendum Note (Signed)
Addended by: Aggie Hacker A on: 03/04/2017 04:27 PM   Modules accepted: Orders

## 2017-03-07 ENCOUNTER — Other Ambulatory Visit: Payer: Self-pay | Admitting: Family Medicine

## 2017-07-06 ENCOUNTER — Encounter: Payer: Self-pay | Admitting: Family Medicine

## 2017-07-06 ENCOUNTER — Other Ambulatory Visit: Payer: Self-pay | Admitting: Family Medicine

## 2017-07-06 NOTE — Telephone Encounter (Signed)
He needs a blood level before any refills

## 2017-07-07 ENCOUNTER — Other Ambulatory Visit: Payer: Self-pay | Admitting: Family Medicine

## 2017-07-07 MED ORDER — SUMATRIPTAN SUCCINATE 100 MG PO TABS: 100.0000 mg | ORAL_TABLET | ORAL | 3 refills | Status: DC | PRN

## 2017-07-14 ENCOUNTER — Ambulatory Visit (INDEPENDENT_AMBULATORY_CARE_PROVIDER_SITE_OTHER): Payer: BLUE CROSS/BLUE SHIELD | Admitting: Family Medicine

## 2017-07-14 ENCOUNTER — Encounter: Payer: Self-pay | Admitting: Family Medicine

## 2017-07-14 VITALS — BP 140/90 | HR 66 | Temp 98.2°F | Ht 70.5 in | Wt 186.0 lb

## 2017-07-14 DIAGNOSIS — Z23 Encounter for immunization: Secondary | ICD-10-CM | POA: Diagnosis not present

## 2017-07-14 DIAGNOSIS — Z0000 Encounter for general adult medical examination without abnormal findings: Secondary | ICD-10-CM | POA: Diagnosis not present

## 2017-07-14 DIAGNOSIS — N138 Other obstructive and reflux uropathy: Secondary | ICD-10-CM

## 2017-07-14 DIAGNOSIS — N529 Male erectile dysfunction, unspecified: Secondary | ICD-10-CM

## 2017-07-14 DIAGNOSIS — E039 Hypothyroidism, unspecified: Secondary | ICD-10-CM

## 2017-07-14 DIAGNOSIS — E291 Testicular hypofunction: Secondary | ICD-10-CM

## 2017-07-14 DIAGNOSIS — N401 Enlarged prostate with lower urinary tract symptoms: Secondary | ICD-10-CM

## 2017-07-14 NOTE — Patient Instructions (Signed)
WE NOW OFFER   [facility] FAST TRACK!!!  SAME DAY Appointments for ACUTE CARE  Such as: Sprains, Injuries, cuts, abrasions, rashes, muscle pain, joint pain, back pain Colds, flu, sore throats, headache, allergies, cough, fever  Ear pain, sinus and eye infections Abdominal pain, nausea, vomiting, diarrhea, upset stomach Animal/insect bites  3 Easy Ways to Schedule: Walk-In Scheduling Call in scheduling Mychart Sign-up: [email or web address]         

## 2017-07-14 NOTE — Progress Notes (Signed)
   Subjective:    Patient ID: Derrick Medina, male    DOB: [birth year], 51 y.o.   MRN: [id number]  HPI Here for a well exam. He feels well in general. He has gained a little weight and this has bumped his BP a bit. He recently got a job promotion and he is required to travel much more than before. He tends to not eat as well as he used to and exercise is hard to get.    Review of Systems  Constitutional: Negative.   HENT: Negative.   Eyes: Negative.   Respiratory: Negative.   Cardiovascular: Negative.   Gastrointestinal: Negative.   Genitourinary: Negative.   Musculoskeletal: Negative.   Skin: Negative.   Neurological: Negative.   Psychiatric/Behavioral: Negative.        Objective:   Physical Exam  Constitutional: He is oriented to person, place, and time. He appears well-developed and well-nourished. No distress.  HENT:  Head: Normocephalic and atraumatic.  Right Ear: External ear normal.  Left Ear: External ear normal.  Nose: Nose normal.  Mouth/Throat: Oropharynx is clear and moist. No oropharyngeal exudate.  Eyes: Pupils are equal, round, and reactive to light. Conjunctivae and EOM are normal. Right eye exhibits no discharge. Left eye exhibits no discharge. No scleral icterus.  Neck: Neck supple. No JVD present. No tracheal deviation present. No thyromegaly present.  Cardiovascular: Normal rate, regular rhythm, normal heart sounds and intact distal pulses.  Exam reveals no gallop and no friction rub.   No murmur heard. Pulmonary/Chest: Effort normal and breath sounds normal. No respiratory distress. He has no wheezes. He has no rales. He exhibits no tenderness.  Abdominal: Soft. Bowel sounds are normal. He exhibits no distension and no mass. There is no tenderness. There is no rebound and no guarding.  Genitourinary: Rectum normal, prostate normal and penis normal. Rectal exam shows guaiac negative stool. No penile tenderness.  Musculoskeletal: Normal range of motion. He  exhibits no edema or tenderness.  Lymphadenopathy:    He has no cervical adenopathy.  Neurological: He is alert and oriented to person, place, and time. He has normal reflexes. No cranial nerve deficit. He exhibits normal muscle tone. Coordination normal.  Skin: Skin is warm and dry. No rash noted. He is not diaphoretic. No erythema. No pallor.  Psychiatric: He has a normal mood and affect. His behavior is normal. Judgment and thought content normal.          Assessment & Plan:  Well exam. We discussed diet and exercise. Set up fasting labs soon.  Alysia Penna, MD

## 2017-07-17 ENCOUNTER — Other Ambulatory Visit (INDEPENDENT_AMBULATORY_CARE_PROVIDER_SITE_OTHER): Payer: BLUE CROSS/BLUE SHIELD

## 2017-07-17 DIAGNOSIS — N401 Enlarged prostate with lower urinary tract symptoms: Secondary | ICD-10-CM | POA: Diagnosis not present

## 2017-07-17 DIAGNOSIS — Z0000 Encounter for general adult medical examination without abnormal findings: Secondary | ICD-10-CM

## 2017-07-17 DIAGNOSIS — N138 Other obstructive and reflux uropathy: Secondary | ICD-10-CM | POA: Diagnosis not present

## 2017-07-17 DIAGNOSIS — N529 Male erectile dysfunction, unspecified: Secondary | ICD-10-CM

## 2017-07-17 LAB — PSA: PSA: 1.39 ng/mL (ref 0.10–4.00)

## 2017-07-17 LAB — HEPATIC FUNCTION PANEL
ALK PHOS: 38 U/L — AB (ref 39–117)
ALT: 17 U/L (ref 0–53)
AST: 16 U/L (ref 0–37)
Albumin: 4.5 g/dL (ref 3.5–5.2)
BILIRUBIN DIRECT: 0.2 mg/dL (ref 0.0–0.3)
BILIRUBIN TOTAL: 1.3 mg/dL — AB (ref 0.2–1.2)
TOTAL PROTEIN: 6.6 g/dL (ref 6.0–8.3)

## 2017-07-17 LAB — POC URINALSYSI DIPSTICK (AUTOMATED)
BILIRUBIN UA: NEGATIVE
Glucose, UA: NEGATIVE
KETONES UA: NEGATIVE
Leukocytes, UA: NEGATIVE
Nitrite, UA: NEGATIVE
PH UA: 8 (ref 5.0–8.0)
Protein, UA: NEGATIVE
Spec Grav, UA: 1.015 (ref 1.010–1.025)
Urobilinogen, UA: 0.2 E.U./dL

## 2017-07-17 LAB — CBC WITH DIFFERENTIAL/PLATELET
BASOS ABS: 0 10*3/uL (ref 0.0–0.1)
Basophils Relative: 0.3 % (ref 0.0–3.0)
EOS ABS: 0.2 10*3/uL (ref 0.0–0.7)
Eosinophils Relative: 3.1 % (ref 0.0–5.0)
HEMATOCRIT: 50.3 % (ref 39.0–52.0)
HEMOGLOBIN: 17 g/dL (ref 13.0–17.0)
LYMPHS PCT: 34.2 % (ref 12.0–46.0)
Lymphs Abs: 2 10*3/uL (ref 0.7–4.0)
MCHC: 33.9 g/dL (ref 30.0–36.0)
MCV: 92.2 fl (ref 78.0–100.0)
Monocytes Absolute: 0.7 10*3/uL (ref 0.1–1.0)
Monocytes Relative: 11.2 % (ref 3.0–12.0)
Neutro Abs: 3 10*3/uL (ref 1.4–7.7)
Neutrophils Relative %: 51.2 % (ref 43.0–77.0)
Platelets: 186 10*3/uL (ref 150.0–400.0)
RBC: 5.46 Mil/uL (ref 4.22–5.81)
RDW: 13.3 % (ref 11.5–15.5)
WBC: 5.9 10*3/uL (ref 4.0–10.5)

## 2017-07-17 LAB — TSH: TSH: 5.88 u[IU]/mL — AB (ref 0.35–4.50)

## 2017-07-17 LAB — BASIC METABOLIC PANEL
BUN: 18 mg/dL (ref 6–23)
CO2: 28 meq/L (ref 19–32)
Calcium: 9.3 mg/dL (ref 8.4–10.5)
Chloride: 103 mEq/L (ref 96–112)
Creatinine, Ser: 1.15 mg/dL (ref 0.40–1.50)
GFR: 71.84 mL/min (ref >60.00)
GLUCOSE: 92 mg/dL (ref 70–99)
POTASSIUM: 4.2 meq/L (ref 3.5–5.1)
SODIUM: 139 meq/L (ref 135–145)

## 2017-07-17 LAB — TESTOSTERONE: TESTOSTERONE: 249.2 ng/dL — AB (ref 300.00–890.00)

## 2017-07-17 LAB — LIPID PANEL
CHOLESTEROL: 289 mg/dL — AB (ref 0–200)
HDL: 49.8 mg/dL (ref >39.00)
LDL Cholesterol: 218 mg/dL — ABNORMAL HIGH (ref 0–99)
NONHDL: 239.18
Total CHOL/HDL Ratio: 6
Triglycerides: 105 mg/dL (ref 0.0–149.0)
VLDL: 21 mg/dL (ref 0.0–40.0)

## 2017-07-22 ENCOUNTER — Other Ambulatory Visit: Payer: Self-pay | Admitting: Family Medicine

## 2017-07-22 MED ORDER — TESTOSTERONE CYPIONATE 200 MG/ML IM SOLN: 400.0000 mg | INTRAMUSCULAR | 1 refills | Status: DC

## 2017-07-22 MED ORDER — LEVOTHYROXINE SODIUM 150 MCG PO TABS: 150.0000 ug | ORAL_TABLET | Freq: Every day | ORAL | 3 refills | Status: DC

## 2017-07-23 ENCOUNTER — Other Ambulatory Visit: Payer: Self-pay | Admitting: Family Medicine

## 2017-07-23 MED ORDER — ATORVASTATIN CALCIUM 20 MG PO TABS: 20.0000 mg | ORAL_TABLET | Freq: Every day | ORAL | 3 refills | Status: DC

## 2017-07-30 ENCOUNTER — Encounter: Payer: Self-pay | Admitting: Family Medicine

## 2017-08-15 ENCOUNTER — Encounter: Payer: Self-pay | Admitting: Family Medicine

## 2017-09-02 ENCOUNTER — Encounter: Payer: Self-pay | Admitting: Family Medicine

## 2017-09-02 DIAGNOSIS — L723 Sebaceous cyst: Secondary | ICD-10-CM

## 2017-09-02 NOTE — Telephone Encounter (Signed)
I will go ahead and do a referral to Plastic Surgery for this.

## 2017-09-09 DIAGNOSIS — D179 Benign lipomatous neoplasm, unspecified: Secondary | ICD-10-CM | POA: Diagnosis not present

## 2017-09-29 DIAGNOSIS — D179 Benign lipomatous neoplasm, unspecified: Secondary | ICD-10-CM | POA: Diagnosis not present

## 2017-09-30 ENCOUNTER — Ambulatory Visit: Payer: Self-pay | Admitting: Plastic Surgery

## 2017-09-30 ENCOUNTER — Other Ambulatory Visit: Payer: Self-pay

## 2017-09-30 ENCOUNTER — Encounter (HOSPITAL_BASED_OUTPATIENT_CLINIC_OR_DEPARTMENT_OTHER): Payer: Self-pay | Admitting: *Deleted

## 2017-10-05 NOTE — Anesthesia Preprocedure Evaluation (Addendum)
Anesthesia Evaluation  Patient identified by MRN, date of birth, ID band Patient awake    Reviewed: Allergy & Precautions, H&P , Patient's Chart, lab work & pertinent test results, reviewed documented beta blocker date and time   Airway Mallampati: II  TM Distance: >3 FB Neck ROM: full    Dental no notable dental hx.    Pulmonary former smoker,    Pulmonary exam normal breath sounds clear to auscultation       Cardiovascular  Rhythm:regular Rate:Normal     Neuro/Psych    GI/Hepatic   Endo/Other    Renal/GU      Musculoskeletal   Abdominal   Peds  Hematology   Anesthesia Other Findings   Reproductive/Obstetrics                             Anesthesia Physical Anesthesia Plan  ASA: II  Anesthesia Plan: General   Post-op Pain Management:    Induction: Intravenous  PONV Risk Score and Plan: Treatment may vary due to age or medical condition, Ondansetron and Dexamethasone  Airway Management Planned: LMA  Additional Equipment:   Intra-op Plan:   Post-operative Plan:   Informed Consent: I have reviewed the patients History and Physical, chart, labs and discussed the procedure including the risks, benefits and alternatives for the proposed anesthesia with the patient or authorized representative who has indicated his/her understanding and acceptance.   Dental Advisory Given  Plan Discussed with: CRNA and Surgeon  Anesthesia Plan Comments: ( )       Anesthesia Quick Evaluation

## 2017-10-06 ENCOUNTER — Other Ambulatory Visit: Payer: Self-pay

## 2017-10-06 ENCOUNTER — Ambulatory Visit (HOSPITAL_BASED_OUTPATIENT_CLINIC_OR_DEPARTMENT_OTHER)
Admission: RE | Admit: 2017-10-06 | Discharge: 2017-10-06 | Disposition: A | Discharge status: Home / Self Care | Payer: BLUE CROSS/BLUE SHIELD | Source: Ambulatory Visit | Attending: Plastic Surgery | Admitting: Plastic Surgery

## 2017-10-06 ENCOUNTER — Encounter (HOSPITAL_BASED_OUTPATIENT_CLINIC_OR_DEPARTMENT_OTHER): Payer: Self-pay

## 2017-10-06 ENCOUNTER — Ambulatory Visit (HOSPITAL_BASED_OUTPATIENT_CLINIC_OR_DEPARTMENT_OTHER): Payer: BLUE CROSS/BLUE SHIELD | Admitting: Anesthesiology

## 2017-10-06 ENCOUNTER — Encounter (HOSPITAL_BASED_OUTPATIENT_CLINIC_OR_DEPARTMENT_OTHER)
Admission: RE | Disposition: A | Discharge status: Home / Self Care | Payer: Self-pay | Source: Ambulatory Visit | Attending: Plastic Surgery

## 2017-10-06 DIAGNOSIS — Z87891 Personal history of nicotine dependence: Secondary | ICD-10-CM | POA: Insufficient documentation

## 2017-10-06 DIAGNOSIS — D170 Benign lipomatous neoplasm of skin and subcutaneous tissue of head, face and neck: Secondary | ICD-10-CM | POA: Diagnosis not present

## 2017-10-06 DIAGNOSIS — Z79890 Hormone replacement therapy (postmenopausal): Secondary | ICD-10-CM | POA: Insufficient documentation

## 2017-10-06 DIAGNOSIS — D1779 Benign lipomatous neoplasm of other sites: Secondary | ICD-10-CM | POA: Diagnosis not present

## 2017-10-06 DIAGNOSIS — D179 Benign lipomatous neoplasm, unspecified: Secondary | ICD-10-CM | POA: Diagnosis not present

## 2017-10-06 DIAGNOSIS — Z79899 Other long term (current) drug therapy: Secondary | ICD-10-CM | POA: Diagnosis not present

## 2017-10-06 DIAGNOSIS — E7800 Pure hypercholesterolemia, unspecified: Secondary | ICD-10-CM | POA: Diagnosis not present

## 2017-10-06 HISTORY — PX: LIPOMA EXCISION: SHX5283

## 2017-10-06 SURGERY — EXCISION LIPOMA
Anesthesia: General | Site: Face

## 2017-10-06 MED ORDER — LIDOCAINE 2% (20 MG/ML) 5 ML SYRINGE
INTRAMUSCULAR | Status: DC | PRN
  Administered 2017-10-06: 100 mg via INTRAVENOUS

## 2017-10-06 MED ORDER — CEFAZOLIN SODIUM-DEXTROSE 2-4 GM/100ML-% IV SOLN
2.0000 g | INTRAVENOUS | Status: AC
  Administered 2017-10-06: 2 g via INTRAVENOUS

## 2017-10-06 MED ORDER — CEFAZOLIN SODIUM-DEXTROSE 1-4 GM/50ML-% IV SOLN: 1.0000 g | Freq: Once | INTRAVENOUS | Status: DC

## 2017-10-06 MED ORDER — CEFAZOLIN SODIUM-DEXTROSE 2-4 GM/100ML-% IV SOLN
INTRAVENOUS | Status: AC
  Filled 2017-10-06: qty 100

## 2017-10-06 MED ORDER — PROPOFOL 10 MG/ML IV BOLUS
INTRAVENOUS | Status: DC | PRN
  Administered 2017-10-06: 200 mg via INTRAVENOUS

## 2017-10-06 MED ORDER — MIDAZOLAM HCL 2 MG/2ML IJ SOLN
1.0000 mg | INTRAMUSCULAR | Status: DC | PRN
  Administered 2017-10-06: 2 mg via INTRAVENOUS

## 2017-10-06 MED ORDER — LACTATED RINGERS IV SOLN
INTRAVENOUS | Status: DC
  Administered 2017-10-06: 07:00:00 via INTRAVENOUS

## 2017-10-06 MED ORDER — PROPOFOL 10 MG/ML IV BOLUS
INTRAVENOUS | Status: AC
  Filled 2017-10-06: qty 20

## 2017-10-06 MED ORDER — BUPIVACAINE-EPINEPHRINE (PF) 0.5% -1:200000 IJ SOLN
INTRAMUSCULAR | Status: AC
  Filled 2017-10-06: qty 30

## 2017-10-06 MED ORDER — ONDANSETRON HCL 4 MG/2ML IJ SOLN
INTRAMUSCULAR | Status: DC | PRN
  Administered 2017-10-06: 4 mg via INTRAVENOUS

## 2017-10-06 MED ORDER — CHLORHEXIDINE GLUCONATE CLOTH 2 % EX PADS: 6.0000 | MEDICATED_PAD | Freq: Once | CUTANEOUS | Status: DC

## 2017-10-06 MED ORDER — LIDOCAINE 2% (20 MG/ML) 5 ML SYRINGE
INTRAMUSCULAR | Status: AC
  Filled 2017-10-06: qty 5

## 2017-10-06 MED ORDER — FENTANYL CITRATE (PF) 100 MCG/2ML IJ SOLN
50.0000 ug | INTRAMUSCULAR | Status: DC | PRN
  Administered 2017-10-06 (×2): 50 ug via INTRAVENOUS

## 2017-10-06 MED ORDER — SCOPOLAMINE 1 MG/3DAYS TD PT72: 1.0000 | MEDICATED_PATCH | Freq: Once | TRANSDERMAL | Status: DC | PRN

## 2017-10-06 MED ORDER — FENTANYL CITRATE (PF) 100 MCG/2ML IJ SOLN
INTRAMUSCULAR | Status: AC
  Filled 2017-10-06: qty 2

## 2017-10-06 MED ORDER — FENTANYL CITRATE (PF) 100 MCG/2ML IJ SOLN: 25.0000 ug | INTRAMUSCULAR | Status: DC | PRN

## 2017-10-06 MED ORDER — DEXAMETHASONE SODIUM PHOSPHATE 10 MG/ML IJ SOLN
INTRAMUSCULAR | Status: DC | PRN
  Administered 2017-10-06: 10 mg via INTRAVENOUS

## 2017-10-06 MED ORDER — ONDANSETRON HCL 4 MG/2ML IJ SOLN
INTRAMUSCULAR | Status: AC
  Filled 2017-10-06: qty 2

## 2017-10-06 MED ORDER — LIDOCAINE-EPINEPHRINE 1 %-1:100000 IJ SOLN
INTRAMUSCULAR | Status: AC
  Filled 2017-10-06: qty 1

## 2017-10-06 MED ORDER — MIDAZOLAM HCL 2 MG/2ML IJ SOLN
INTRAMUSCULAR | Status: AC
  Filled 2017-10-06: qty 2

## 2017-10-06 MED ORDER — LIDOCAINE-EPINEPHRINE 1 %-1:100000 IJ SOLN
INTRAMUSCULAR | Status: DC | PRN
  Administered 2017-10-06: 4 mL

## 2017-10-06 MED ORDER — DEXAMETHASONE SODIUM PHOSPHATE 10 MG/ML IJ SOLN
INTRAMUSCULAR | Status: AC
  Filled 2017-10-06: qty 1

## 2017-10-06 MED ORDER — BACITRACIN ZINC 500 UNIT/GM EX OINT
TOPICAL_OINTMENT | CUTANEOUS | Status: AC
  Filled 2017-10-06: qty 2.7

## 2017-10-06 SURGICAL SUPPLY — 64 items
APL SKNCLS STERI-STRIP NONHPOA (GAUZE/BANDAGES/DRESSINGS)
BALL CTTN LRG ABS STRL LF (GAUZE/BANDAGES/DRESSINGS)
BANDAGE ACE 6X5 VEL STRL LF (GAUZE/BANDAGES/DRESSINGS) IMPLANT
BENZOIN TINCTURE PRP APPL 2/3 (GAUZE/BANDAGES/DRESSINGS) IMPLANT
BLADE HEX COATED 2.75 (ELECTRODE) ×3 IMPLANT
BLADE KNIFE PERSONA 15 (BLADE) ×1 IMPLANT
BLADE SURG 10 STRL SS (BLADE) IMPLANT
BLADE SURG 15 STRL LF DISP TIS (BLADE) IMPLANT
BLADE SURG 15 STRL SS (BLADE) ×3
CANISTER SUCT 1200ML W/VALVE (MISCELLANEOUS) IMPLANT
CLOSURE WOUND 1/2 X4 (GAUZE/BANDAGES/DRESSINGS)
COTTONBALL LRG STERILE PKG (GAUZE/BANDAGES/DRESSINGS) IMPLANT
COVER BACK TABLE 60X90IN (DRAPES) ×3 IMPLANT
COVER MAYO STAND STRL (DRAPES) ×3 IMPLANT
DECANTER SPIKE VIAL GLASS SM (MISCELLANEOUS) ×3 IMPLANT
DRAPE LAPAROTOMY 100X72 PEDS (DRAPES) IMPLANT
DRAPE U-SHAPE 76X120 STRL (DRAPES) ×2 IMPLANT
ELECT NDL TIP 2.8 STRL (NEEDLE) IMPLANT
ELECT NEEDLE TIP 2.8 STRL (NEEDLE) IMPLANT
ELECT REM PT RETURN 9FT ADLT (ELECTROSURGICAL) ×3
ELECTRODE REM PT RTRN 9FT ADLT (ELECTROSURGICAL) ×1 IMPLANT
GAUZE SPONGE 4X4 12PLY STRL (GAUZE/BANDAGES/DRESSINGS) ×2 IMPLANT
GAUZE SPONGE 4X4 12PLY STRL LF (GAUZE/BANDAGES/DRESSINGS) IMPLANT
GAUZE SPONGE 4X4 16PLY XRAY LF (GAUZE/BANDAGES/DRESSINGS) IMPLANT
GAUZE XEROFORM 1X8 LF (GAUZE/BANDAGES/DRESSINGS) ×2 IMPLANT
GLOVE BIO SURGEON STRL SZ7 (GLOVE) ×3 IMPLANT
GOWN STRL REUS W/ TWL LRG LVL3 (GOWN DISPOSABLE) ×2 IMPLANT
GOWN STRL REUS W/TWL LRG LVL3 (GOWN DISPOSABLE) ×6
NDL HYPO 25X1 1.5 SAFETY (NEEDLE) ×1 IMPLANT
NDL HYPO 30X.5 LL (NEEDLE) IMPLANT
NDL SPNL 18GX3.5 QUINCKE PK (NEEDLE) IMPLANT
NEEDLE HYPO 25X1 1.5 SAFETY (NEEDLE) ×3 IMPLANT
NEEDLE HYPO 30X.5 LL (NEEDLE) IMPLANT
NEEDLE SPNL 18GX3.5 QUINCKE PK (NEEDLE) IMPLANT
NS IRRIG 1000ML POUR BTL (IV SOLUTION) IMPLANT
PACK BASIN DAY SURGERY FS (CUSTOM PROCEDURE TRAY) ×3 IMPLANT
PAD ALCOHOL SWAB (MISCELLANEOUS) IMPLANT
PEN SKIN MARKING BROAD TIP (MISCELLANEOUS) IMPLANT
PENCIL BUTTON HOLSTER BLD 10FT (ELECTRODE) ×3 IMPLANT
SHEET MEDIUM DRAPE 40X70 STRL (DRAPES) ×2 IMPLANT
SLEEVE SCD COMPRESS KNEE MED (MISCELLANEOUS) ×2 IMPLANT
STRIP CLOSURE SKIN 1/2X4 (GAUZE/BANDAGES/DRESSINGS) IMPLANT
SUCTION FRAZIER HANDLE 10FR (MISCELLANEOUS) ×2
SUCTION TUBE FRAZIER 10FR DISP (MISCELLANEOUS) IMPLANT
SUT MNCRL AB 3-0 PS2 18 (SUTURE) ×2 IMPLANT
SUT MNCRL AB 4-0 PS2 18 (SUTURE) IMPLANT
SUT MON AB 2-0 CT1 36 (SUTURE) IMPLANT
SUT MON AB 4-0 PC3 18 (SUTURE) IMPLANT
SUT MON AB 5-0 PS2 18 (SUTURE) IMPLANT
SUT PROLENE 5 0 PS 2 (SUTURE) ×2 IMPLANT
SUT PROLENE 6 0 P 1 18 (SUTURE) ×2 IMPLANT
SUT SILK 4 0 PS 2 (SUTURE) IMPLANT
SUT SILK 6 0 P 1 (SUTURE) IMPLANT
SUT VIC AB 5-0 P-3 18X BRD (SUTURE) IMPLANT
SUT VIC AB 5-0 P3 18 (SUTURE)
SYR BULB 3OZ (MISCELLANEOUS) ×2 IMPLANT
SYR BULB IRRIGATION 50ML (SYRINGE) IMPLANT
SYR CONTROL 10ML LL (SYRINGE) ×3 IMPLANT
TOWEL OR 17X24 6PK STRL BLUE (TOWEL DISPOSABLE) ×6 IMPLANT
TRAY DSU PREP LF (CUSTOM PROCEDURE TRAY) ×3 IMPLANT
TUBE CONNECTING 20'X1/4 (TUBING) ×1
TUBE CONNECTING 20X1/4 (TUBING) ×1 IMPLANT
UNDERPAD 30X30 (UNDERPADS AND DIAPERS) ×2 IMPLANT
YANKAUER SUCT BULB TIP NO VENT (SUCTIONS) IMPLANT

## 2017-10-06 NOTE — Brief Op Note (Signed)
10/06/2017  8:51 AM  PATIENT:  Elane Fritz  51 y.o. 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   Delay start of Pharmacological VTE agent (>24hrs) due to surgical blood loss or risk of bleeding: not applicable

## 2017-10-06 NOTE — Transfer of Care (Signed)
Immediate Anesthesia Transfer of Care Note  Patient: Derrick Medina  Procedure(s) Performed: EXCISION LIPOMA (N/A Face)  Patient Location: PACU  Anesthesia Type:General  Level of Consciousness: awake, alert  and oriented  Airway & Oxygen Therapy: Patient Spontanous Breathing and Patient connected to face mask oxygen  Post-op Assessment: Report given to RN and Post -op Vital signs reviewed and stable  Post vital signs: Reviewed and stable  Last Vitals:  Vitals:   10/06/17 0651  BP: (!) 143/93  Pulse: 83  Resp: 18  Temp: 36.7 C  SpO2: 98%    Last Pain:  Vitals:   10/06/17 0651  TempSrc: Oral         Complications: No apparent anesthesia complications

## 2017-10-06 NOTE — H&P (Signed)
  H&P faxed to surgical center.  -History and Physical Reviewed  -Patient has been re-examined  -No change in the plan of care  Derrick Medina,Derrick Medina    

## 2017-10-06 NOTE — Anesthesia Procedure Notes (Signed)
Procedure Name: LMA Insertion Date/Time: 10/06/2017 7:50 AM Performed by: Talbot Grumbling, CRNA Pre-anesthesia Checklist: Patient identified, Emergency Drugs available, Suction available and Patient being monitored Patient Re-evaluated:Patient Re-evaluated prior to induction Oxygen Delivery Method: Circle system utilized Preoxygenation: Pre-oxygenation with 100% oxygen Induction Type: IV induction Ventilation: Mask ventilation without difficulty LMA: LMA inserted LMA Size: 4.0 Number of attempts: 1 Placement Confirmation: positive ETCO2 and breath sounds checked- equal and bilateral Tube secured with: Tape Dental Injury: Teeth and Oropharynx as per pre-operative assessment

## 2017-10-06 NOTE — Anesthesia Postprocedure Evaluation (Signed)
Anesthesia Post Note  Patient: Derrick Medina  Procedure(s) Performed: EXCISION LIPOMA (N/A Face)     Patient location during evaluation: PACU Anesthesia Type: General Level of consciousness: awake and alert Pain management: pain level controlled Vital Signs Assessment: post-procedure vital signs reviewed and stable Respiratory status: spontaneous breathing, nonlabored ventilation, respiratory function stable and patient connected to nasal cannula oxygen Cardiovascular status: blood pressure returned to baseline and stable Postop Assessment: no apparent nausea or vomiting Anesthetic complications: no    Last Vitals:  Vitals:   10/06/17 0915 10/06/17 0942  BP: (!) 138/91   Pulse: 67 66  Resp: 13 20  Temp:  36.5 C  SpO2: 97% 97%    Last Pain:  Vitals:   10/06/17 0942  TempSrc:   PainSc: 0-No pain                 Oriya Kettering EDWARD

## 2017-10-06 NOTE — Discharge Instructions (Signed)
°  Post Anesthesia Home Care Instructions  Activity: Get plenty of rest for the remainder of the day. A responsible individual must stay with you for 24 hours following the procedure.  For the next 24 hours, DO NOT: -Drive a car -Paediatric nurse -Drink alcoholic beverages -Take any medication unless instructed by your physician -Make any legal decisions or sign important papers.  Meals: Start with liquid foods such as gelatin or soup. Progress to regular foods as tolerated. Avoid greasy, spicy, heavy foods. If nausea and/or vomiting occur, drink only clear liquids until the nausea and/or vomiting subsides. Call your physician if vomiting continues.  Special Instructions/Symptoms: Your throat may feel dry or sore from the anesthesia or the breathing tube placed in your throat during surgery. If this causes discomfort, gargle with warm salt water. The discomfort should disappear within 24 hours.  If you had a scopolamine patch placed behind your ear for the management of post- operative nausea and/or vomiting:  1. The medication in the patch is effective for 72 hours, after which it should be removed.  Wrap patch in a tissue and discard in the trash. Wash hands thoroughly with soap and water. 2. You may remove the patch earlier than 72 hours if you experience unpleasant side effects which may include dry mouth, dizziness or visual disturbances. 3. Avoid touching the patch. Wash your hands with soap and water after contact with the patch.      1. Avoid bending or stooping of head. 2. Lortab 5mg /325 mg tabs 1-2 tabs po q 4-6 hours prn pain- prescription given in office. 3. Augmentin 875 mg. 1 tab po bid- prescription given in office. 4. Sterapred dose pack as directed- prescription given in office. 5. Follow-up appointment Wednesday, 10/07/17, in office.

## 2017-10-07 ENCOUNTER — Encounter (HOSPITAL_BASED_OUTPATIENT_CLINIC_OR_DEPARTMENT_OTHER): Payer: Self-pay | Admitting: Plastic Surgery

## 2017-10-14 ENCOUNTER — Encounter: Payer: Self-pay | Admitting: Family Medicine

## 2017-10-16 ENCOUNTER — Telehealth: Payer: Self-pay | Admitting: Family Medicine

## 2017-10-16 NOTE — Telephone Encounter (Signed)
Health Screening Results form to be filled out.  Upon completion call [phone] to be picked up.  Form placed in dr's folder.

## 2017-10-21 NOTE — Telephone Encounter (Signed)
Form done. Placed up front for pick up and faxed. Copy placed to be scanned into pt's chart. Called pt and left a VM that form is ready for pick up.

## 2017-11-07 ENCOUNTER — Encounter: Payer: Self-pay | Admitting: Family Medicine

## 2017-11-09 ENCOUNTER — Other Ambulatory Visit: Payer: Self-pay | Admitting: Family Medicine

## 2017-11-09 NOTE — Telephone Encounter (Signed)
Copied from [place] [phone]. Topic: Quick Communication - See Telephone Encounter >> Nov 09, 2017 12:49 PM Vernona Rieger wrote: CRM for notification. See Telephone encounter for:   11/09/17. Pt is requesting his a refill on his IMITREX 100mg , he said he only gets 12 in a script and its not time for the refill but said that Dr Sarajane Jews would send a new script when he needed it. He is going out of town Friday so needs it by then. [facility]acy [facility].

## 2017-11-09 NOTE — Telephone Encounter (Signed)
Please see patient request for Imitrex 100mg , no refills. Thanks.

## 2017-11-11 NOTE — Telephone Encounter (Signed)
Last OV 07/14/2017. Rx was last refilled 07/02/2017 disp 12 with 3 refills. Sent to PCP for approval.

## 2017-11-12 ENCOUNTER — Encounter: Payer: Self-pay | Admitting: Family Medicine

## 2017-11-12 ENCOUNTER — Ambulatory Visit (INDEPENDENT_AMBULATORY_CARE_PROVIDER_SITE_OTHER): Payer: BLUE CROSS/BLUE SHIELD | Admitting: Family Medicine

## 2017-11-12 VITALS — BP 118/66 | HR 73 | Temp 98.2°F | Wt 185.4 lb

## 2017-11-12 DIAGNOSIS — G43801 Other migraine, not intractable, with status migrainosus: Secondary | ICD-10-CM | POA: Diagnosis not present

## 2017-11-12 MED ORDER — SUMATRIPTAN SUCCINATE 50 MG PO TABS: 50.0000 mg | ORAL_TABLET | ORAL | 5 refills | Status: DC | PRN

## 2017-11-12 MED ORDER — PROPRANOLOL HCL ER 60 MG PO CP24: 60.0000 mg | ORAL_CAPSULE | Freq: Every day | ORAL | 5 refills | Status: DC

## 2017-11-12 NOTE — Progress Notes (Signed)
   Subjective:    Patient ID: Derrick Medina, male    DOB: 07-16-1966, 52 y.o.   MRN: [id number]  HPI Here to discuss headaches. He has had cluster headaches for years but they have become much more frequent. He now averages 2-3 a day. He gets excellent relief with Sumatriptan but he has to takes this 2-3 times a day to get by. He has tried Gabapentin and Topamax in the past for prevention but could tolerate the side effects.    Review of Systems  Constitutional: Negative.   Respiratory: Negative.   Cardiovascular: Negative.   Neurological: Positive for headaches.       Objective:   Physical Exam  Constitutional: He is oriented to person, place, and time. He appears well-developed and well-nourished.  Cardiovascular: Normal rate, regular rhythm, normal heart sounds and intact distal pulses.  Pulmonary/Chest: Effort normal and breath sounds normal. No respiratory distress. He has no wheezes. He has no rales.  Neurological: He is alert and oriented to person, place, and time. No cranial nerve deficit. He exhibits normal muscle tone. Coordination normal.          Assessment & Plan:  Cluster headaches. We refilled Sumatriptan to use prn. For prophylaxis he will try Propranolol LA 60 mg daily. Alysia Penna, MD

## 2017-11-12 NOTE — Telephone Encounter (Signed)
He was seen OV today  

## 2017-11-25 NOTE — Op Note (Signed)
NAME:  Derrick Medina, Derrick Medina NO.:  [id number]  MEDICAL RECORD NO.:  [id number]  LOCATION:                                 FACILITY:  PHYSICIAN:  Hetty Blend, M.D.DATE OF BIRTH:  [birth year]  DATE OF PROCEDURE:  10/06/2017 DATE OF DISCHARGE:                              OPERATIVE REPORT   PREOPERATIVE DIAGNOSIS:  Probable lipoma, left forehead.  POSTOPERATIVE DIAGNOSIS:  Lipoma, left forehead.  PROCEDURES: 1. Complex closure 3.0 cm of left forehead incision. 2. Wide local excision 2.6 cm of left forehead lipoma.  ATTENTION SURGEON:  Hetty Blend, MD.  ANESTHESIA:  General.  COMPLICATIONS:  None.  INDICATIONS FOR THE PROCEDURE:  The patient is a 52 year old Caucasian male, who has a soft tissue mass on the left forehead that is becoming more tender overtime and has been slowly growing.  He presents to have it excised.  DESCRIPTION OF PROCEDURE:  The patient was marked in the preop holding area for the excision of the left forehead soft tissue mass and then he was taken back to the OR.  After adequate general anesthesia was obtained, the face was prepped with Betadine and draped in sterile fashion.  Skin and subcutaneous tissues in the area of the left forehead soft tissue mass were infiltrated with 1% lidocaine with epinephrine.  After adequate hemostasis and anesthesia had taken effect, the procedure was begun.  An incision was made over the left central aspect of the left forehead soft tissue mass.  This was made with a knife and carried down to the subcutaneous tissues.  I then proceeded into the subcutaneous tissues and saw that the soft tissue mass was positioned below the frontalis muscle.  The frontalis muscle was then incised in a vertical direction to allow access to the soft tissue mass.  Upon entering the space below the frontalis muscle, it was apparent that this was a soft tissue lipoma. The lipoma had some subcutaneous extensions and  was widely excised as much as possible.  Meticulous hemostasis was obtained with the Bovie electrocautery.  Total length of the lipoma measured 2.6 cm.  Then the wound was irrigated with saline irrigation.  The wound was closed in a complex fashion.  The frontalis muscle was sutured using 3-0 Monocryl suture. The deeper subcutaneous tissues were then sutured using 3-0 Monocryl suture.  The deep dermal layer was also closed with a 3-0 Monocryl suture.  The skin was then closed with a 5-0 Prolene running baseball- type stitch.  The incision was dressed with bacitracin ointment and Adaptic followed by 4 x 4 gauze and a light pressure dressing was placed over the wound with gauze and Hypafix tape.  Total length of the complex incision closure was 3.0 cm.  Final needle and sponge counts were reported to be correct at the end of the case.  The patient was then awakened from general anesthesia.  He was then taken to the recovery room in stable condition.  He was recovered without complications.  Both the patient and his family were given proper postoperative wound care instructions.  He was then discharged home in the care of his family in stable condition.  He will leave  the pressure dressing in place.  Followup appointment will be tomorrow in the office.          ______________________________ Hetty Blend, M.D.     MC/MEDQ  D:  11/25/2017  T:  11/25/2017  Job:  [id number]  cc:   Hetty Blend, M.D.'s office Ishmael Holter. Sarajane Jews, MD

## 2017-12-08 ENCOUNTER — Encounter: Payer: Self-pay | Admitting: Family Medicine

## 2017-12-08 NOTE — Telephone Encounter (Signed)
Please refill the testosterone for another 6 months. I have put in orders for a testosterone level and a thyroid level for him to make a lab appt

## 2017-12-08 NOTE — Addendum Note (Signed)
Addended by: Alysia Penna A on: 12/08/2017 05:27 PM   Modules accepted: Orders

## 2017-12-09 ENCOUNTER — Other Ambulatory Visit: Payer: Self-pay

## 2017-12-09 MED ORDER — TESTOSTERONE CYPIONATE 200 MG/ML IM SOLN: 400.0000 mg | INTRAMUSCULAR | 0 refills | Status: DC

## 2017-12-09 MED ORDER — TESTOSTERONE CYPIONATE 200 MG/ML IM SOLN: 400.0000 mg | INTRAMUSCULAR | 1 refills | Status: DC

## 2017-12-15 ENCOUNTER — Other Ambulatory Visit (INDEPENDENT_AMBULATORY_CARE_PROVIDER_SITE_OTHER): Payer: BLUE CROSS/BLUE SHIELD

## 2017-12-15 DIAGNOSIS — E039 Hypothyroidism, unspecified: Secondary | ICD-10-CM

## 2017-12-15 DIAGNOSIS — E291 Testicular hypofunction: Secondary | ICD-10-CM

## 2017-12-15 LAB — TSH: TSH: 3.65 u[IU]/mL (ref 0.35–4.50)

## 2017-12-15 LAB — TESTOSTERONE: TESTOSTERONE: 653.39 ng/dL (ref 300.00–890.00)

## 2017-12-25 ENCOUNTER — Encounter: Payer: Self-pay | Admitting: Family Medicine

## 2018-01-01 ENCOUNTER — Other Ambulatory Visit: Payer: Self-pay

## 2018-01-01 MED ORDER — TESTOSTERONE CYPIONATE 200 MG/ML IM SOLN: 400.0000 mg | INTRAMUSCULAR | 0 refills | Status: DC

## 2018-01-01 NOTE — Telephone Encounter (Signed)
Please change his rx to give him a 10 ml bottle at a time

## 2018-01-09 ENCOUNTER — Encounter: Payer: Self-pay | Admitting: Family Medicine

## 2018-01-15 ENCOUNTER — Other Ambulatory Visit: Payer: Self-pay | Admitting: Family Medicine

## 2018-01-15 MED ORDER — TESTOSTERONE CYPIONATE 200 MG/ML IM SOLN: 400.0000 mg | INTRAMUSCULAR | 0 refills | Status: DC

## 2018-01-15 NOTE — Telephone Encounter (Signed)
Spoke to pharmacy.  They did NOT get the prescription that was called in 12/2017.  They no longer have the 10 ml bottles.  Pt gets 2 mls to last for 1 month.  Needs to be written for 4 ml per month.  Pt does have 1 refill on file that pharmacy will use today.  All new prescriptions will be placed on file from today forward.

## 2018-01-15 NOTE — Telephone Encounter (Signed)
Copied from [place]. Topic: Inquiry >> Jan 15, 2018  7:13 AM Pricilla Handler wrote: Reason for CRM: Patient called stating that he needs his testosterone cypionate (DEPOTESTOSTERONE CYPIONATE) 200 MG/ML injection in the 62ml dosage sent to his pharmacy today. Patient says he is due for today. [facility]'s preferred [facility] is [id number] - [place], [place] - [address] [phone] (Phone)  [phone] (Fax).

## 2018-01-15 NOTE — Telephone Encounter (Signed)
LOV 11/12/17 Dr. Sarajane Jews [facility] [address]

## 2018-01-15 NOTE — Telephone Encounter (Signed)
Pt notified but then asked for a 90 days prescription (12 ml) so new prescription printed for Dr. Sarajane Jews to sign and will then fax.

## 2018-04-15 ENCOUNTER — Encounter: Payer: Self-pay | Admitting: Family Medicine

## 2018-04-15 ENCOUNTER — Ambulatory Visit (INDEPENDENT_AMBULATORY_CARE_PROVIDER_SITE_OTHER): Payer: BLUE CROSS/BLUE SHIELD | Admitting: Family Medicine

## 2018-04-15 VITALS — BP 120/80 | HR 88 | Temp 97.8°F | Ht 70.0 in | Wt 186.6 lb

## 2018-04-15 DIAGNOSIS — H00014 Hordeolum externum left upper eyelid: Secondary | ICD-10-CM | POA: Diagnosis not present

## 2018-04-15 MED ORDER — MUPIROCIN 2 % EX OINT: TOPICAL_OINTMENT | Freq: Three times a day (TID) | CUTANEOUS | 0 refills | Status: DC

## 2018-04-15 NOTE — Progress Notes (Signed)
   Subjective:    Patient ID: Derrick Medina, male    DOB: [birth year], 52 y.o.   MRN: [id number]  HPI Here for a large stye on the left upper eyelid that appeared 2 weeks ago. It is not very painful but it has been slowly enlarging to the point it is blurring his vision.   Review of Systems  Constitutional: Negative.   Eyes: Positive for visual disturbance.  Respiratory: Negative.   Cardiovascular: Negative.        Objective:   Physical Exam  Constitutional: He appears well-developed and well-nourished.  Eyes: Conjunctivae are normal.  The left upper lid has a large slightly tender cystic lesion  Cardiovascular: Normal rate, regular rhythm, normal heart sounds and intact distal pulses.  Pulmonary/Chest: Effort normal and breath sounds normal.          Assessment & Plan:  Stye. This was opened with a sterile needle and a small amount of purulent material was expressed. He will apply Mupiricin ointment and warm compresses. Recheck prn.  Alysia Penna, MD

## 2018-05-14 ENCOUNTER — Encounter: Payer: Self-pay | Admitting: Family Medicine

## 2018-05-14 MED ORDER — TESTOSTERONE CYPIONATE 200 MG/ML IM SOLN: INTRAMUSCULAR | 5 refills | Status: DC

## 2018-05-14 NOTE — Telephone Encounter (Signed)
Rx done. 

## 2018-05-14 NOTE — Telephone Encounter (Signed)
Change his dose of testosterone to 1.5 ml (or 300 mg) every 14 days. Refill for 6 months

## 2018-05-14 NOTE — Addendum Note (Signed)
Addended by: Agnes Lawrence on: 05/14/2018 03:56 PM   Modules accepted: Orders

## 2018-05-14 NOTE — Telephone Encounter (Signed)
Please advise 

## 2018-06-07 ENCOUNTER — Encounter: Payer: Self-pay | Admitting: Family Medicine

## 2018-06-07 MED ORDER — TESTOSTERONE CYPIONATE 200 MG/ML IM SOLN: 300.0000 mg | INTRAMUSCULAR | 1 refills | Status: DC

## 2018-06-07 NOTE — Telephone Encounter (Signed)
[facility]ulse. The last rx was called in for 1.5 mL, but pharmacy incorrectly put it into their computer as 2 mL, so patient did receive instructions for 2 mL. Pharmacy will contact patient regarding this. On our end, script states it was called in to dispense 10 mL w/ 5 RF, however pharmacy states it was only called in for 3 mL w/ no refills. Per last notes, medication was supposed to be filled for 6 months, not one month. I called in a new script to [facility] for a 90 day supply w/ 1 RF and sent MyChart message to notify patient.

## 2018-06-15 ENCOUNTER — Telehealth: Payer: Self-pay | Admitting: *Deleted

## 2018-06-15 NOTE — Telephone Encounter (Signed)
Copied from [place] [id number]. 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  He is not due until Saturday, 06/19/18.  Please advise.  CB# [phone].    Patient's can be reached at [phone].

## 2018-06-15 NOTE — Telephone Encounter (Signed)
Okay per Dr. Sarajane Jews. Pharmacy and patient notified. No further needs at this time.

## 2018-07-16 ENCOUNTER — Encounter: Payer: Self-pay | Admitting: Family Medicine

## 2018-07-16 NOTE — Telephone Encounter (Signed)
Last fill 07/22/17 and 07/23/17 Last OV 04/15/18 (acute) Last physical 07/14/17  Ok to give 90 days for both?

## 2018-07-19 ENCOUNTER — Telehealth: Payer: Self-pay | Admitting: Family Medicine

## 2018-07-19 MED ORDER — LEVOTHYROXINE SODIUM 150 MCG PO TABS: 150.0000 ug | ORAL_TABLET | Freq: Every day | ORAL | 0 refills | Status: DC

## 2018-07-19 NOTE — Telephone Encounter (Signed)
Medication filled to pharmacy as requested.   

## 2018-07-19 NOTE — Telephone Encounter (Signed)
Copied from [place] [id number]. 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  Pt needing the medications above to be filled today at [id number] - [place], [place] - [address]

## 2018-07-20 NOTE — Telephone Encounter (Signed)
Please refill both for one year

## 2018-07-21 MED ORDER — LEVOTHYROXINE SODIUM 150 MCG PO TABS: 150.0000 ug | ORAL_TABLET | Freq: Every day | ORAL | 3 refills | Status: DC

## 2018-07-21 MED ORDER — ATORVASTATIN CALCIUM 20 MG PO TABS: 20.0000 mg | ORAL_TABLET | Freq: Every day | ORAL | 3 refills | Status: DC

## 2018-07-21 NOTE — Telephone Encounter (Signed)
Rx had been pended then approved. They have been resent for correct month supply.

## 2018-07-21 NOTE — Addendum Note (Signed)
Addended by: Rebecca Eaton on: 07/21/2018 10:11 AM   Modules accepted: Orders

## 2018-08-20 ENCOUNTER — Encounter: Payer: Self-pay | Admitting: Family Medicine

## 2018-08-20 ENCOUNTER — Ambulatory Visit (INDEPENDENT_AMBULATORY_CARE_PROVIDER_SITE_OTHER): Payer: BLUE CROSS/BLUE SHIELD | Admitting: Family Medicine

## 2018-08-20 VITALS — BP 128/64 | HR 82 | Temp 98.1°F | Ht 70.0 in | Wt 189.0 lb

## 2018-08-20 DIAGNOSIS — Z0000 Encounter for general adult medical examination without abnormal findings: Secondary | ICD-10-CM | POA: Diagnosis not present

## 2018-08-20 DIAGNOSIS — Z125 Encounter for screening for malignant neoplasm of prostate: Secondary | ICD-10-CM

## 2018-08-20 DIAGNOSIS — Z23 Encounter for immunization: Secondary | ICD-10-CM

## 2018-08-20 LAB — HEPATIC FUNCTION PANEL
ALBUMIN: 4.6 g/dL (ref 3.5–5.2)
ALT: 27 U/L (ref 0–53)
AST: 18 U/L (ref 0–37)
Alkaline Phosphatase: 50 U/L (ref 39–117)
Bilirubin, Direct: 0.3 mg/dL (ref 0.0–0.3)
TOTAL PROTEIN: 7 g/dL (ref 6.0–8.3)
Total Bilirubin: 1.5 mg/dL — ABNORMAL HIGH (ref 0.2–1.2)

## 2018-08-20 LAB — BASIC METABOLIC PANEL
BUN: 15 mg/dL (ref 6–23)
CHLORIDE: 103 meq/L (ref 96–112)
CO2: 33 mEq/L — ABNORMAL HIGH (ref 19–32)
CREATININE: 1.17 mg/dL (ref 0.40–1.50)
Calcium: 9.5 mg/dL (ref 8.4–10.5)
GFR: 70.11 mL/min (ref >60.00)
GLUCOSE: 83 mg/dL (ref 70–99)
Potassium: 4.4 mEq/L (ref 3.5–5.1)
Sodium: 141 mEq/L (ref 135–145)

## 2018-08-20 LAB — CBC WITH DIFFERENTIAL/PLATELET
Basophils Absolute: 0 10*3/uL (ref 0.0–0.1)
Basophils Relative: 0.5 % (ref 0.0–3.0)
EOS ABS: 0.3 10*3/uL (ref 0.0–0.7)
Eosinophils Relative: 5.2 % — ABNORMAL HIGH (ref 0.0–5.0)
HCT: 53.6 % — ABNORMAL HIGH (ref 39.0–52.0)
Hemoglobin: 18.2 g/dL (ref 13.0–17.0)
Lymphocytes Relative: 37.9 % (ref 12.0–46.0)
Lymphs Abs: 2 10*3/uL (ref 0.7–4.0)
MCHC: 33.6 g/dL (ref 30.0–36.0)
MCV: 91.6 fl (ref 78.0–100.0)
MONO ABS: 0.8 10*3/uL (ref 0.1–1.0)
Monocytes Relative: 15.1 % — ABNORMAL HIGH (ref 3.0–12.0)
NEUTROS PCT: 41.3 % — AB (ref 43.0–77.0)
Neutro Abs: 2.2 10*3/uL (ref 1.4–7.7)
Platelets: 181 10*3/uL (ref 150.0–400.0)
RBC: 5.91 Mil/uL — AB (ref 4.22–5.81)
RDW: 13.7 % (ref 11.5–15.5)
WBC: 5.3 10*3/uL (ref 4.0–10.5)

## 2018-08-20 LAB — LIPID PANEL
CHOL/HDL RATIO: 3
CHOLESTEROL: 154 mg/dL (ref 0–200)
HDL: 45.6 mg/dL (ref >39.00)
LDL CALC: 90 mg/dL (ref 0–99)
NonHDL: 108.41
TRIGLYCERIDES: 93 mg/dL (ref 0.0–149.0)
VLDL: 18.6 mg/dL (ref 0.0–40.0)

## 2018-08-20 LAB — PSA: PSA: 0.88 ng/mL (ref 0.10–4.00)

## 2018-08-20 LAB — TSH: TSH: 7.62 u[IU]/mL — AB (ref 0.35–4.50)

## 2018-08-20 MED ORDER — SUMATRIPTAN SUCCINATE 50 MG PO TABS: 50.0000 mg | ORAL_TABLET | ORAL | 5 refills | Status: DC | PRN

## 2018-08-20 MED ORDER — PROPRANOLOL HCL ER 60 MG PO CP24: 60.0000 mg | ORAL_CAPSULE | Freq: Every day | ORAL | 5 refills | Status: DC

## 2018-08-20 NOTE — Progress Notes (Signed)
   Subjective:    Patient ID: Derrick Medina, male    DOB: 1966-02-28, 52 y.o.   MRN: [id number]  HPI Here for a well exam. He feels fine although he is getting over a URI. He had been coughing but this has imrproved.    Review of Systems  Constitutional: Negative.   HENT: Negative.   Eyes: Negative.   Respiratory: Negative.   Cardiovascular: Negative.   Gastrointestinal: Negative.   Genitourinary: Negative.   Musculoskeletal: Negative.   Skin: Negative.   Neurological: Negative.   Psychiatric/Behavioral: Negative.        Objective:   Physical Exam  Constitutional: He is oriented to person, place, and time. He appears well-developed and well-nourished. No distress.  HENT:  Head: Normocephalic and atraumatic.  Right Ear: External ear normal.  Left Ear: External ear normal.  Nose: Nose normal.  Mouth/Throat: Oropharynx is clear and moist. No oropharyngeal exudate.  Eyes: Pupils are equal, round, and reactive to light. Conjunctivae and EOM are normal. Right eye exhibits no discharge. Left eye exhibits no discharge. No scleral icterus.  Neck: Neck supple. No JVD present. No tracheal deviation present. No thyromegaly present.  Cardiovascular: Normal rate, regular rhythm, normal heart sounds and intact distal pulses. Exam reveals no gallop and no friction rub.  No murmur heard. Pulmonary/Chest: Effort normal and breath sounds normal. No respiratory distress. He has no wheezes. He has no rales. He exhibits no tenderness.  Abdominal: Soft. Bowel sounds are normal. He exhibits no distension and no mass. There is no tenderness. There is no rebound and no guarding.  Genitourinary: Rectum normal, prostate normal and penis normal. Rectal exam shows guaiac negative stool. No penile tenderness.  Musculoskeletal: Normal range of motion. He exhibits no edema or tenderness.  Lymphadenopathy:    He has no cervical adenopathy.  Neurological: He is alert and oriented to person, place, and time.  He has normal reflexes. He displays normal reflexes. No cranial nerve deficit. He exhibits normal muscle tone. Coordination normal.  Skin: Skin is warm and dry. No rash noted. He is not diaphoretic. No erythema. No pallor.  Psychiatric: He has a normal mood and affect. His behavior is normal. Judgment and thought content normal.          Assessment & Plan:  Well exam. We discussed diet and exercise. Get fasting labs.  Alysia Penna, MD

## 2018-08-25 ENCOUNTER — Other Ambulatory Visit: Payer: Self-pay | Admitting: Family Medicine

## 2018-08-25 DIAGNOSIS — E039 Hypothyroidism, unspecified: Secondary | ICD-10-CM

## 2018-08-25 DIAGNOSIS — D582 Other hemoglobinopathies: Secondary | ICD-10-CM

## 2018-08-25 MED ORDER — LEVOTHYROXINE SODIUM 175 MCG PO CAPS: 1.0000 | ORAL_CAPSULE | Freq: Every day | ORAL | 1 refills | Status: DC

## 2018-09-15 ENCOUNTER — Encounter: Payer: Self-pay | Admitting: Family Medicine

## 2018-09-15 NOTE — Telephone Encounter (Signed)
Last fill 06/07/18 Last OV 08/20/18  Ok to fill?

## 2018-09-21 NOTE — Telephone Encounter (Signed)
Refill for 6 months. 

## 2018-09-23 MED ORDER — TESTOSTERONE CYPIONATE 200 MG/ML IM SOLN: 300.0000 mg | INTRAMUSCULAR | 1 refills | Status: DC

## 2018-09-23 NOTE — Telephone Encounter (Signed)
Pt called in to follow up on refill request. Showing approved by PCP, advised pt. Pt would like to know if this could be sent in as soon as possible as he would like to pick up before the weekend.

## 2018-09-23 NOTE — Telephone Encounter (Signed)
Spoke with patient he stated he has already received these prescriptions, e was calling to check on the testosterone refill. The Rx has been sent in and patient is aware.

## 2018-09-23 NOTE — Telephone Encounter (Signed)
Rx has been sent in. Patient is aware. 

## 2018-09-30 ENCOUNTER — Telehealth: Payer: Self-pay | Admitting: Family Medicine

## 2018-09-30 NOTE — Telephone Encounter (Signed)
[employer] form to be filled out;placed in dr's folder.  Call [phone] upon completion.

## 2018-10-01 NOTE — Telephone Encounter (Signed)
Dr. Sarajane Jews please advise on completion of forms. Thanks

## 2018-10-02 NOTE — Telephone Encounter (Signed)
The form is ready  

## 2018-10-04 NOTE — Telephone Encounter (Signed)
Informed Derrick Medina that the pt will pick up on 10/06/18.  She will place paper work at the front desk.  Nothing further needed.

## 2018-10-04 NOTE — Telephone Encounter (Signed)
I have called and lmom to make the pt aware that forms are completed and will be faxed in today.

## 2018-10-04 NOTE — Telephone Encounter (Signed)
Patient called to say that he will pick form on 10/06/18 when he get back in town. He was informed that for will be at front desk per Mignon Pine 10/04/18.

## 2018-10-08 ENCOUNTER — Other Ambulatory Visit: Payer: Self-pay | Admitting: Family Medicine

## 2018-10-14 NOTE — Telephone Encounter (Signed)
Pt's wife picked up forms.

## 2018-12-18 ENCOUNTER — Other Ambulatory Visit: Payer: Self-pay | Admitting: Family Medicine

## 2018-12-23 ENCOUNTER — Encounter: Payer: Self-pay | Admitting: Family Medicine

## 2018-12-23 DIAGNOSIS — E291 Testicular hypofunction: Secondary | ICD-10-CM

## 2018-12-23 NOTE — Telephone Encounter (Signed)
Pt called and stated that he will be out of town until march 16th for work. Pt would like a call back from the nurse. He would like enough medication sent in until then. Please advise  [phone]

## 2018-12-23 NOTE — Telephone Encounter (Signed)
Thanks. I called in to the office to elaborate as I thought my bloodwork in October covered this. I have a rough schedule of flying, my flights leave Monday 6:00 and I pretty much come home Friday evenings. I am out of town the next 4 weeks in [place], [place], [place] and then [facility]. Then I have a home week March 16-20 (I can do it then) but I am out of the prescription. I can see about changing a flight, but I have to pay the $200 change fee and I need to check my calendar to make sure I can do it. Not trying to be difficult, but I travel a lot for work. Can we refill my 90 day supply, because I do not want to up my dose any more even if needed. Then I can at least have time to schedule bloodwork. I will know by tomorrow on a back up plan for bloodwork but would need to come in on Monday before a flight if I can change it (10:00 appointment would be needed) if by a slim chance I can get a flight change to noon. Sorry for the long email....   Dr. Sarajane Jews please advise. Thanks

## 2018-12-23 NOTE — Telephone Encounter (Signed)
It has been a year since we checked a testosterone level. He needs to come by for a lab draw first before we can refill this

## 2018-12-24 MED ORDER — TESTOSTERONE CYPIONATE 200 MG/ML IM SOLN: 300.0000 mg | INTRAMUSCULAR | 0 refills | Status: DC

## 2018-12-24 NOTE — Addendum Note (Signed)
Addended by: Elie Confer on: 12/24/2018 08:48 AM   Modules accepted: Orders

## 2018-12-24 NOTE — Telephone Encounter (Signed)
rx has been called to the pharmacy and left on the VM

## 2018-12-24 NOTE — Telephone Encounter (Signed)
Please call in a 90 day supply and he can come in for a lab draw that week in March. No need for an OV

## 2018-12-31 ENCOUNTER — Encounter: Payer: Self-pay | Admitting: Family Medicine

## 2019-01-20 ENCOUNTER — Encounter: Payer: Self-pay | Admitting: Gastroenterology

## 2019-01-25 ENCOUNTER — Telehealth: Payer: Self-pay | Admitting: *Deleted

## 2019-01-25 ENCOUNTER — Encounter: Payer: Self-pay | Admitting: Family Medicine

## 2019-01-25 NOTE — Telephone Encounter (Signed)
Covid-19 travel screening questions  Have you traveled in the last 14 days? If yes where?  Do you now or have you had a fever in the last 14 days?  Do you have any respiratory symptoms of shortness of breath or cough now or in the last 14 days?  Do you have a medical history of Congestive Heart Failure?  Do you have a medical history of lung disease?  Do you have any family members or close contacts with diagnosed or suspected Covid-19?   [id number] to call office back

## 2019-01-25 NOTE — Telephone Encounter (Signed)
Covid-19 travel screening questions  Have you traveled in the last 14 days? [place]- no flying If yes where?  Do you now or have you had a fever in the last 14 days? no  Do you have any respiratory symptoms of shortness of breath or cough now or in the last 14 days? no  Do you have a medical history of Congestive Heart Failure? no  Do you have a medical history of lung disease? no  Do you have any family members or close contacts with diagnosed or suspected Covid-19? no

## 2019-01-25 NOTE — Telephone Encounter (Signed)
Pt returned your call.  

## 2019-01-26 ENCOUNTER — Other Ambulatory Visit: Payer: Self-pay

## 2019-01-26 ENCOUNTER — Encounter: Payer: Self-pay | Admitting: Gastroenterology

## 2019-01-26 ENCOUNTER — Ambulatory Visit (AMBULATORY_SURGERY_CENTER): Payer: Self-pay | Admitting: *Deleted

## 2019-01-26 ENCOUNTER — Other Ambulatory Visit (INDEPENDENT_AMBULATORY_CARE_PROVIDER_SITE_OTHER): Payer: BLUE CROSS/BLUE SHIELD

## 2019-01-26 VITALS — Temp 98.6°F | Ht 70.0 in | Wt 196.0 lb

## 2019-01-26 DIAGNOSIS — E291 Testicular hypofunction: Secondary | ICD-10-CM | POA: Diagnosis not present

## 2019-01-26 DIAGNOSIS — Z1211 Encounter for screening for malignant neoplasm of colon: Secondary | ICD-10-CM

## 2019-01-26 LAB — TESTOSTERONE: Testosterone: 313.82 ng/dL (ref 300.00–890.00)

## 2019-01-26 MED ORDER — NA SULFATE-K SULFATE-MG SULF 17.5-3.13-1.6 GM/177ML PO SOLN: 1.0000 | Freq: Once | ORAL | 0 refills | Status: AC

## 2019-01-26 NOTE — Progress Notes (Signed)
No egg or soy allergy known to patient  No issues with past sedation with any surgeries  or procedures, no intubation problems  No diet pills per patient No home 02 use per patient  No blood thinners per patient  Pt denies issues with constipation  No A fib or A flutter  EMMI video sent to pt's e mail -declined  Suprep $15 on line coupon

## 2019-01-27 ENCOUNTER — Other Ambulatory Visit: Payer: Self-pay | Admitting: *Deleted

## 2019-01-27 MED ORDER — TESTOSTERONE CYPIONATE 200 MG/ML IM SOLN: 300.0000 mg | INTRAMUSCULAR | 0 refills | Status: DC

## 2019-02-02 ENCOUNTER — Encounter: Payer: BLUE CROSS/BLUE SHIELD | Admitting: Gastroenterology

## 2019-02-04 ENCOUNTER — Encounter: Payer: BLUE CROSS/BLUE SHIELD | Admitting: Gastroenterology

## 2019-02-04 ENCOUNTER — Other Ambulatory Visit: Payer: Self-pay | Admitting: Family Medicine

## 2019-02-09 ENCOUNTER — Telehealth: Payer: Self-pay

## 2019-02-09 NOTE — Telephone Encounter (Signed)
Covid-19 travel screening questions  Have you traveled in the last 14 days? No.  If yes where?  Do you now or have you had a fever in the last 14 days? No.  Do you have any respiratory symptoms of shortness of breath or cough now or in the last 14 days? No.  Do you have a medical history of Congestive Heart Failure?  Do you have a medical history of lung disease?  Do you have any family members or close contacts with diagnosed or suspected Covid-19? No.  Talked to patients wife about husbands procedure and let her know about our new "care partner policy" and how she will stay in the car in the parking lot and we will call when he is finished.

## 2019-02-09 NOTE — Telephone Encounter (Signed)
Called patient back to inform him that we were going to have to reschedule him because he was a screening colonoscopy and currently we are no t able to do any "elective" procedures. He understood and I explained that we will reschedule him as soon as we are up and running normal again.

## 2019-02-10 ENCOUNTER — Encounter: Payer: BLUE CROSS/BLUE SHIELD | Admitting: Internal Medicine

## 2019-02-15 ENCOUNTER — Other Ambulatory Visit: Payer: Self-pay | Admitting: Family Medicine

## 2019-03-14 ENCOUNTER — Telehealth: Payer: Self-pay | Admitting: *Deleted

## 2019-03-14 NOTE — Telephone Encounter (Signed)
Called patient to reschedule previously cancelled colonoscopy due to Covid-19. Rescheduled to 04/05/19 at 1030 am. Pt has prep. Instructions redone and mailed to patient. Advised of care partner restrictions and advised to bring mask and wear into the building.

## 2019-04-03 ENCOUNTER — Other Ambulatory Visit: Payer: Self-pay | Admitting: Family Medicine

## 2019-04-03 ENCOUNTER — Telehealth: Payer: Self-pay | Admitting: *Deleted

## 2019-04-03 NOTE — Telephone Encounter (Signed)
Covid-19 travel screening questions  Have you traveled in the last 14 days? No If yes where?  Do you now or have you had a fever in the last 14 days? No  Do you have any respiratory symptoms of shortness of breath or cough now or in the last 14 days? No  Do you have any family members or close contacts with diagnosed or suspected Covid-19? No  Aware care partner to wait in the car in the parking lot for the procedure and will wear a mask into the building.

## 2019-04-05 ENCOUNTER — Encounter: Payer: Self-pay | Admitting: Gastroenterology

## 2019-04-05 ENCOUNTER — Ambulatory Visit (AMBULATORY_SURGERY_CENTER): Payer: BLUE CROSS/BLUE SHIELD | Admitting: Gastroenterology

## 2019-04-05 ENCOUNTER — Other Ambulatory Visit: Payer: Self-pay

## 2019-04-05 VITALS — BP 131/81 | HR 66 | Temp 97.6°F | Resp 14 | Ht 70.0 in | Wt 196.0 lb

## 2019-04-05 DIAGNOSIS — Z1211 Encounter for screening for malignant neoplasm of colon: Secondary | ICD-10-CM

## 2019-04-05 DIAGNOSIS — K635 Polyp of colon: Secondary | ICD-10-CM

## 2019-04-05 DIAGNOSIS — D122 Benign neoplasm of ascending colon: Secondary | ICD-10-CM

## 2019-04-05 HISTORY — PX: COLONOSCOPY: SHX174

## 2019-04-05 MED ORDER — SODIUM CHLORIDE 0.9 % IV SOLN: 500.0000 mL | Freq: Once | INTRAVENOUS | Status: DC

## 2019-04-05 NOTE — Progress Notes (Signed)
Pt's states no medical or surgical changes since previsit or office visit. 

## 2019-04-05 NOTE — Progress Notes (Signed)
[place], [place], [place]

## 2019-04-05 NOTE — Op Note (Signed)
[facility] Patient Name: Derrick Medina Procedure Date: 04/05/2019 10:36 AM MRN: [id number] Endoscopist: Ladene Artist , MD Age: 53 Referring MD:  Date of Birth: 11-01-1966 Gender: Male Account #: [id number] Procedure:                Colonoscopy Indications:              Screening for colorectal malignant neoplasm Medicines:                Monitored Anesthesia Care Procedure:                Pre-Anesthesia Assessment:                           - Prior to the procedure, a History and Physical                            was performed, and patient medications and                            allergies were reviewed. The patient's tolerance of                            previous anesthesia was also reviewed. The risks                            and benefits of the procedure and the sedation                            options and risks were discussed with the patient.                            All questions were answered, and informed consent                            was obtained. Prior Anticoagulants: The patient has                            taken no previous anticoagulant or antiplatelet                            agents. ASA Grade Assessment: II - A patient with                            mild systemic disease. After reviewing the risks                            and benefits, the patient was deemed in                            satisfactory condition to undergo the procedure.                           After obtaining informed consent, the colonoscope  was passed under direct vision. Throughout the                            procedure, the patient's blood pressure, pulse, and                            oxygen saturations were monitored continuously. The                            Colonoscope was introduced through the anus and                            advanced to the the cecum, identified by                            appendiceal orifice and  ileocecal valve. The                            ileocecal valve, appendiceal orifice, and rectum                            were photographed. The quality of the bowel                            preparation was excellent. The colonoscopy was                            performed without difficulty. The patient tolerated                            the procedure well. Scope In: 10:39:50 AM Scope Out: 10:56:07 AM Scope Withdrawal Time: 0 hours 13 minutes 56 seconds  Total Procedure Duration: 0 hours 16 minutes 17 seconds  Findings:                 The perianal and digital rectal examinations were                            normal.                           A 6 mm polyp was found in the ascending colon. The                            polyp was sessile. The polyp was removed with a                            cold snare. Resection and retrieval were complete.                           A few small-mouthed diverticula were found in the                            left colon.  Internal hemorrhoids were found during                            retroflexion. The hemorrhoids were small and Grade                            I (internal hemorrhoids that do not prolapse).                           The exam was otherwise without abnormality on                            direct and retroflexion views. Complications:            No immediate complications. Estimated blood loss:                            None. Estimated Blood Loss:     Estimated blood loss: none. Impression:               - One 6 mm polyp in the ascending colon, removed                            with a cold snare. Resected and retrieved.                           - Left colon diverticulosis.                           - Internal hemorrhoids.                           - The examination was otherwise normal on direct                            and retroflexion views. Recommendation:           - Repeat colonoscopy date  to be determined after                            pending pathology results are reviewed.                           - Patient has a contact number available for                            emergencies. The signs and symptoms of potential                            delayed complications were discussed with the                            patient. Return to normal activities tomorrow.                            Written discharge instructions were provided to the  patient.                           - High fiber diet.                           - Continue present medications.                           - Await pathology results. Ladene Artist, MD 04/05/2019 11:07:57 AM This report has been signed electronically.

## 2019-04-05 NOTE — Patient Instructions (Signed)
Information on polyps and hemorrhoids given to you today.Marland Kitchen  Await pathology results.  YOU HAD AN ENDOSCOPIC PROCEDURE TODAY AT [facility]:   Refer to the procedure report that was given to you for any specific questions about what was found during the examination.  If the procedure report does not answer your questions, please call your gastroenterologist to clarify.  If you requested that your care partner not be given the details of your procedure findings, then the procedure report has been included in a sealed envelope for you to review at your convenience later.  YOU SHOULD EXPECT: Some feelings of bloating in the abdomen. Passage of more gas than usual.  Walking can help get rid of the air that was put into your GI tract during the procedure and reduce the bloating. If you had a lower endoscopy (such as a colonoscopy or flexible sigmoidoscopy) you may notice spotting of blood in your stool or on the toilet paper. If you underwent a bowel prep for your procedure, you may not have a normal bowel movement for a few days.  Please Note:  You might notice some irritation and congestion in your nose or some drainage.  This is from the oxygen used during your procedure.  There is no need for concern and it should clear up in a day or so.  SYMPTOMS TO REPORT IMMEDIATELY:   Following lower endoscopy (colonoscopy or flexible sigmoidoscopy):  Excessive amounts of blood in the stool  Significant tenderness or worsening of abdominal pains  Swelling of the abdomen that is new, acute  Fever of 100F or higher  Black, tarry-looking stools  For urgent or emergent issues, a gastroenterologist can be reached at any hour by calling [phone].   DIET:  We do recommend a small meal at first, but then you may proceed to your regular diet.  Drink plenty of fluids but you should avoid alcoholic beverages for 24 hours.  ACTIVITY:  You should plan to take it easy for the rest of today and  you should NOT DRIVE or use heavy machinery until tomorrow (because of the sedation medicines used during the test).    FOLLOW UP: Our staff will call the number listed on your records 48-72 hours following your procedure to check on you and address any questions or concerns that you may have regarding the information given to you following your procedure. If we do not reach you, we will leave a message.  We will attempt to reach you two times.  During this call, we will ask if you have developed any symptoms of COVID 19. If you develop any symptoms (ie: fever, flu-like symptoms, shortness of breath, cough etc.) before then, please call [phone].  If you test positive for Covid 19 in the 2 weeks post procedure, please call and report this information to [place].    If any biopsies were taken you will be contacted by phone or by letter within the next 1-3 weeks.  Please call us at [phone] if you have not heard about the biopsies in 3 weeks.    SIGNATURES/CONFIDENTIALITY: You and/or your care partner have signed paperwork which will be entered into your electronic medical record.  These signatures attest to the fact that that the information above on your After Visit Summary has been reviewed and is understood.  Full responsibility of the confidentiality of this discharge information lies with you and/or your care-partner.

## 2019-04-05 NOTE — Progress Notes (Signed)
Report given to PACU, vss 

## 2019-04-05 NOTE — Progress Notes (Signed)
Called to room to assist during endoscopic procedure.  Patient ID and intended procedure confirmed with present staff. Received instructions for my participation in the procedure from the performing physician.  

## 2019-04-07 ENCOUNTER — Telehealth: Payer: Self-pay

## 2019-04-07 NOTE — Telephone Encounter (Signed)
  Follow up Call-  Call back number 04/05/2019  Post procedure Call Back phone  # [phone]  Permission to leave phone message Yes  Some recent data might be hidden     Patient questions:  Do you have a fever, pain , or abdominal swelling? No. Pain Score  0 *  Have you tolerated food without any problems? Yes.    Have you been able to return to your normal activities? Yes.    Do you have any questions about your discharge instructions: Diet   No. Medications  No. Follow up visit  No.  Do you have questions or concerns about your Care? No.  Actions: * If pain score is 4 or above: No action needed, pain <4. 1. Have you developed a fever since your procedure? no  2.   Have you had an respiratory symptoms (SOB or cough) since your procedure? no  3.   Have you tested positive for COVID 19 since your procedure no  4.   Have you had any family members/close contacts diagnosed with the COVID 19 since your procedure?  no   If any of these questions are a yes, please inquire if patient has been seen by family doctor and route this note to Joylene John, Therapist, sports.

## 2019-04-11 NOTE — Telephone Encounter (Signed)
Dr. Fry please advise on refill. Thanks  

## 2019-04-14 ENCOUNTER — Encounter: Payer: Self-pay | Admitting: Gastroenterology

## 2019-06-24 DIAGNOSIS — Z20828 Contact with and (suspected) exposure to other viral communicable diseases: Secondary | ICD-10-CM | POA: Diagnosis not present

## 2019-08-01 ENCOUNTER — Encounter: Payer: Self-pay | Admitting: Family Medicine

## 2019-08-01 MED ORDER — ATORVASTATIN CALCIUM 20 MG PO TABS: 20.0000 mg | ORAL_TABLET | Freq: Every day | ORAL | 0 refills | Status: DC

## 2019-08-01 MED ORDER — LEVOTHYROXINE SODIUM 175 MCG PO TABS: 175.0000 ug | ORAL_TABLET | Freq: Every day | ORAL | 0 refills | Status: DC

## 2019-08-02 MED ORDER — ATORVASTATIN CALCIUM 20 MG PO TABS: 20.0000 mg | ORAL_TABLET | Freq: Every day | ORAL | 0 refills | Status: DC

## 2019-08-02 MED ORDER — LEVOTHYROXINE SODIUM 175 MCG PO TABS: 175.0000 ug | ORAL_TABLET | Freq: Every day | ORAL | 0 refills | Status: DC

## 2019-08-28 ENCOUNTER — Other Ambulatory Visit: Payer: Self-pay | Admitting: Family Medicine

## 2019-08-29 ENCOUNTER — Ambulatory Visit (INDEPENDENT_AMBULATORY_CARE_PROVIDER_SITE_OTHER): Payer: BC Managed Care – PPO | Admitting: Family Medicine

## 2019-08-29 ENCOUNTER — Encounter: Payer: Self-pay | Admitting: Family Medicine

## 2019-08-29 ENCOUNTER — Ambulatory Visit: Payer: BC Managed Care – PPO | Admitting: Family Medicine

## 2019-08-29 ENCOUNTER — Other Ambulatory Visit: Payer: Self-pay

## 2019-08-29 VITALS — BP 160/100 | HR 88 | Temp 96.3°F | Resp 12 | Ht 70.0 in | Wt 188.6 lb

## 2019-08-29 DIAGNOSIS — Z125 Encounter for screening for malignant neoplasm of prostate: Secondary | ICD-10-CM | POA: Diagnosis not present

## 2019-08-29 DIAGNOSIS — Z131 Encounter for screening for diabetes mellitus: Secondary | ICD-10-CM

## 2019-08-29 DIAGNOSIS — I10 Essential (primary) hypertension: Secondary | ICD-10-CM

## 2019-08-29 DIAGNOSIS — E785 Hyperlipidemia, unspecified: Secondary | ICD-10-CM

## 2019-08-29 DIAGNOSIS — D582 Other hemoglobinopathies: Secondary | ICD-10-CM | POA: Diagnosis not present

## 2019-08-29 DIAGNOSIS — E291 Testicular hypofunction: Secondary | ICD-10-CM

## 2019-08-29 DIAGNOSIS — R209 Unspecified disturbances of skin sensation: Secondary | ICD-10-CM

## 2019-08-29 DIAGNOSIS — E039 Hypothyroidism, unspecified: Secondary | ICD-10-CM | POA: Diagnosis not present

## 2019-08-29 DIAGNOSIS — K219 Gastro-esophageal reflux disease without esophagitis: Secondary | ICD-10-CM

## 2019-08-29 DIAGNOSIS — R0789 Other chest pain: Secondary | ICD-10-CM | POA: Diagnosis not present

## 2019-08-29 DIAGNOSIS — Z0000 Encounter for general adult medical examination without abnormal findings: Secondary | ICD-10-CM | POA: Diagnosis not present

## 2019-08-29 LAB — LIPID PANEL
Cholesterol: 162 mg/dL (ref 0–200)
HDL: 47.1 mg/dL (ref >39.00)
LDL Cholesterol: 98 mg/dL (ref 0–99)
NonHDL: 115.33
Total CHOL/HDL Ratio: 3
Triglycerides: 88 mg/dL (ref 0.0–149.0)
VLDL: 17.6 mg/dL (ref 0.0–40.0)

## 2019-08-29 LAB — CBC WITH DIFFERENTIAL/PLATELET
Basophils Absolute: 0 10*3/uL (ref 0.0–0.1)
Basophils Relative: 0.3 % (ref 0.0–3.0)
Eosinophils Absolute: 0.2 10*3/uL (ref 0.0–0.7)
Eosinophils Relative: 2.4 % (ref 0.0–5.0)
HCT: 53.3 % — ABNORMAL HIGH (ref 39.0–52.0)
Hemoglobin: 17.9 g/dL — ABNORMAL HIGH (ref 13.0–17.0)
Lymphocytes Relative: 26.8 % (ref 12.0–46.0)
Lymphs Abs: 2 10*3/uL (ref 0.7–4.0)
MCHC: 33.6 g/dL (ref 30.0–36.0)
MCV: 90.9 fl (ref 78.0–100.0)
Monocytes Absolute: 0.7 10*3/uL (ref 0.1–1.0)
Monocytes Relative: 9.3 % (ref 3.0–12.0)
Neutro Abs: 4.7 10*3/uL (ref 1.4–7.7)
Neutrophils Relative %: 61.2 % (ref 43.0–77.0)
Platelets: 203 10*3/uL (ref 150.0–400.0)
RBC: 5.86 Mil/uL — ABNORMAL HIGH (ref 4.22–5.81)
RDW: 13.4 % (ref 11.5–15.5)
WBC: 7.6 10*3/uL (ref 4.0–10.5)

## 2019-08-29 LAB — COMPREHENSIVE METABOLIC PANEL
ALT: 26 U/L (ref 0–53)
AST: 15 U/L (ref 0–37)
Albumin: 4.7 g/dL (ref 3.5–5.2)
Alkaline Phosphatase: 53 U/L (ref 39–117)
BUN: 16 mg/dL (ref 6–23)
CO2: 29 mEq/L (ref 19–32)
Calcium: 9.5 mg/dL (ref 8.4–10.5)
Chloride: 102 mEq/L (ref 96–112)
Creatinine, Ser: 1.14 mg/dL (ref 0.40–1.50)
GFR: 67.69 mL/min (ref >60.00)
Glucose, Bld: 96 mg/dL (ref 70–99)
Potassium: 4.2 mEq/L (ref 3.5–5.1)
Sodium: 139 mEq/L (ref 135–145)
Total Bilirubin: 2.4 mg/dL — ABNORMAL HIGH (ref 0.2–1.2)
Total Protein: 6.7 g/dL (ref 6.0–8.3)

## 2019-08-29 LAB — TSH: TSH: 0.66 u[IU]/mL (ref 0.35–4.50)

## 2019-08-29 LAB — TESTOSTERONE: Testosterone: 1062.13 ng/dL — ABNORMAL HIGH (ref 300.00–890.00)

## 2019-08-29 LAB — PSA: PSA: 0.69 ng/mL (ref 0.10–4.00)

## 2019-08-29 MED ORDER — AMLODIPINE BESYLATE 5 MG PO TABS: 5.0000 mg | ORAL_TABLET | Freq: Every day | ORAL | 2 refills | Status: DC

## 2019-08-29 MED ORDER — OMEPRAZOLE 40 MG PO CPDR: 40.0000 mg | DELAYED_RELEASE_CAPSULE | Freq: Every day | ORAL | 3 refills | Status: DC

## 2019-08-29 NOTE — Patient Instructions (Addendum)
A few things to remember from today's visit:   Routine general medical examination at a health care facility  Chest tightness - Plan: EKG 12-Lead  Elevated hemoglobin (HCC)  Hypothyroidism, unspecified type - Plan: TSH  Hypogonadism in male - Plan: CBC with Differential, Testosterone  Hyperlipidemia, unspecified hyperlipidemia type - Plan: Lipid panel  Gastroesophageal reflux disease, unspecified whether esophagitis present - Plan: omeprazole (PRILOSEC) 40 MG capsule  Diabetes mellitus screening - Plan: Comprehensive metabolic panel  Prostate cancer screening - Plan: PSA(Must document that pt has been informed of limitations of PSA testing.)  Hypertension, essential, benign - Plan: amLODipine (NORVASC) 5 MG tablet  Blood pressure goal for most people is less than 140/90. Some populations (older than 60) the goal is less than 150/90.  Today Amlodipine and Omeprazole added. You can try over the counter Cetirizine and see if this helps with skin itching. I am suspecting that it is related to elevated hemoglobin.   Most recent cardiologists' recommendations recommend blood pressure at or less than 130/80.   Elevated blood pressure increases the risk of strokes, heart and kidney disease, and eye problems. Regular physical activity and a healthy diet (DASH diet) usually help. Low salt diet. Take medications as instructed.  Caution with some over the counter medications as cold medications, dietary products (for weight loss), and Ibuprofen or Aleve (frequent use);all these medications could cause elevation of blood pressure.    At least 150 minutes of moderate exercise per week, daily brisk walking for 15-30 min is a good exercise option. Healthy diet low in saturated (animal) fats and sweets and consisting of fresh fruits and vegetables, lean meats such as fish and white chicken and whole grains.  - Vaccines:  Tdap vaccine every 10 years.  Shingles vaccine recommended at  age 88, could be given after 51 years of age but not sure about insurance coverage.  Pneumonia vaccines:  Prevnar 72 at 35 and Pneumovax at 64.   -Screening recommendations for low/normal risk males:  Screening for diabetes at age 12 and every 3 years. Earlier screening if cardiovascular risk factors.  Colon cancer screening at age 16 and until age 68.  Prostate cancer screening: some controversy, starts usually at 42: Rectal exam and PSA.  Aortic Abdominal Aneurism once between 52 and 88 years old if ever smoker.  Also recommended:  1. Dental visit- Brush and floss your teeth twice daily; visit your dentist twice a year. 2. Eye doctor- Get an eye exam at least every 2 years. 3. Helmet use- Always wear a helmet when riding a bicycle, motorcycle, rollerblading or skateboarding. 4. Safe sex- If you may be exposed to sexually transmitted infections, use a condom. 5. Seat belts- Seat belts can save your live; always wear one. 6. Smoke/Carbon Monoxide detectors- These detectors need to be installed on the appropriate level of your home. Replace batteries at least once a year. 7. Skin cancer- When out in the sun please cover up and use sunscreen 15 SPF or higher. 8. Violence- If anyone is threatening or hurting you, please tell your healthcare provider.  9. Drink alcohol in moderation- Limit alcohol intake to one drink or less per day. Never drink and drive.  Please be sure medication list is accurate. If a new problem present, please set up appointment sooner than planned today.

## 2019-08-29 NOTE — Progress Notes (Signed)
HPI:  Mr. Derrick Medina is a 53 y.o.male here today for his routine physical examination. He is pt of Dr Sarajane Jews, he could not be here today and pt needed his CPE today.  Last CPE: 08/21/19.  He lives with his wife and 85 yo daughter. He travels frequently, he is in town weekends.  Regular exercise 3 or more times per week: He has not been exercising regularly for about 2 years due to work schedule.   Following a healthy diet: Not consistently, due to his job he is invited to eat out frequently.  Chronic medical problems: Low testosterone, GERD, hyperlipidemia, ED, migraine headaches, post ablative hypothyroidism.  Hx of STD's: Negative.  Immunization History  Administered Date(s) Administered  . Influenza Split 10/22/2011, 10/13/2012  . Influenza Whole 10/26/2009  . Influenza,inj,Quad PF,6+ Mos 09/18/2016, 07/14/2017, 08/20/2018  . Influenza-Unspecified 08/27/2014   Last colon cancer screening: Colonoscopy on 04/05/2019. Last prostate ca screening: PSA on 08/20/2018. He denies nocturia or changes in urinary frequency.  -Denies high alcohol intake, tobacco use, or Hx of illicit drug use. As part of his job he is frequently invited to eat out and drink, he may drink 5-6 in one night, beer,wine among some.  -Concerns and/or follow up today:   Skin burning and pruritus with sun,sweat,and hot shower.  This is "very uncomfortable", so he avoids activities that can cause sweating. Problem is intermittent and last about 10 min. No associated rash or skin erythema.  He has had problem for about 4 years. He reports having negative work up,including abdominal imaging. Negative for worsening headaches.  Lab Results  Component Value Date   WBC 5.3 08/20/2018   HGB 18.2 Repeated and verified X2. ([place]) 08/20/2018   HCT 53.6 (H) 08/20/2018   MCV 91.6 08/20/2018   PLT 181.0 08/20/2018   Lab Results  Component Value Date   CHOL 154 08/20/2018   HDL 45.60 08/20/2018   LDLCALC 90  08/20/2018   LDLDIRECT 155.8 12/14/2012   TRIG 93.0 08/20/2018   CHOLHDL 3 08/20/2018   Lab Results  Component Value Date   CREATININE 1.17 08/20/2018   BUN 15 08/20/2018   NA 141 08/20/2018   K 4.4 08/20/2018   CL 103 08/20/2018   CO2 33 (H) 08/20/2018   Hypogonadism: He has been on testosterone replacement for about 4-5 years. He is not sure if testosterone is helping with ED, he thinks it has helped mainly with fatigue.  He denies depressed mood even though he has been under a lot of stress.  His wife stays home, doing home school with their daughter.  He states that his daughter was having suicidal thoughts, so she had to be institutionalized for about 2 weeks.  He receives frequent phone calls from his wife while he is traveling because of daughter behavior.  Lab Results  Component Value Date   TESTOSTERONE 313.82 01/26/2019    Lab Results  Component Value Date   ALT 27 08/20/2018   AST 18 08/20/2018   ALKPHOS 50 08/20/2018   BILITOT 1.5 (H) 08/20/2018   Hypothyroidism: Currently he is on levothyroxine 175 mcg daily. Tolerating medication well, no side effects reported. He has not noted dysphagia, palpitations, abdominal pain, changes in bowel habits, tremor, cold/heat intolerance, or abnormal weight loss.  Lab Results  Component Value Date   TSH 7.62 (H) 08/20/2018   Elevated BP: No known Hx of HTN but reports a few elevated BP's in the past year. Last visit with  his GI for colonoscopy because elevated, he thought it was because he was nervous. He has not noted unusual headache, visual changes, exertional chest pain, dyspnea, or edema. He does not check BP periodically.  He is c/o 4 to 5 months of chest tightness sensation that happens mainly at night when he is in bed. Upper mid chest that can last all day. Sometimes he feels like he cannot breathe normally, short of breath.  Negative for diaphoresis, palpitations, or dizziness associated. He has not tried OTC  medications. Problem seems to be stable. No associated cough or wheezing. He does not feel this is associated with food intake.   Review of Systems  Constitutional: Positive for fatigue. Negative for activity change, appetite change, fever and unexpected weight change.  HENT: Negative for dental problem, mouth sores, nosebleeds, sore throat, trouble swallowing and voice change.   Eyes: Negative for redness and visual disturbance.  Respiratory: Negative for cough and wheezing.   Cardiovascular: Negative for leg swelling.  Gastrointestinal: Negative for abdominal pain, blood in stool, nausea and vomiting.  Endocrine: Negative for polydipsia, polyphagia and polyuria.  Genitourinary: Negative for decreased urine volume, dysuria, genital sores, hematuria and testicular pain.  Musculoskeletal: Negative for gait problem and myalgias.  Skin: Negative for color change and pallor.  Allergic/Immunologic: Positive for environmental allergies.  Neurological: Negative for syncope, weakness and headaches.  Hematological: Negative for adenopathy. Does not bruise/bleed easily.  Psychiatric/Behavioral: Negative for confusion and sleep disturbance. The patient is nervous/anxious.   All other systems reviewed and are negative.  Current Outpatient Medications on File Prior to Visit  Medication Sig Dispense Refill  . B-D 3CC LUER-LOK SYR 22GX1" 22G X 1" 3 ML MISC USE EVERY 14 DAYS AS DIRECTED 25 each 5  . NEEDLE, DISP, 25 G 25G X 1-1/2" MISC 1 mL by Does not apply route every 14 (fourteen) days. 10 each 1  . propranolol ER (INDERAL LA) 60 MG 24 hr capsule Take 1 capsule (60 mg total) by mouth daily. 30 capsule 5  . SUMAtriptan (IMITREX) 50 MG tablet TAKE 1 TABLET EVERY 2 (TWO) HOURS AS NEEDED FOR MIGRAINE. MAY REPEAT IN 2 HOURS IF HEADACHE PERSISTS OR RECURS. 30 tablet 5  . Syringe, Disposable, 3 ML MISC 1 mL by Does not apply route every 14 (fourteen) days. 25 each 5   No current facility-administered  medications on file prior to visit.      Past Medical History:  Diagnosis Date  . Heart murmur   . Hyperlipidemia   . Hyperthyroidism 11/26/2011    had radioactive ablation on 01-30-12  . Migraines     Past Surgical History:  Procedure Laterality Date  . COLONOSCOPY  2006  . LIPOMA EXCISION N/A 10/06/2017   Procedure: EXCISION LIPOMA;  Surgeon: Youlanda Roys, MD;  Location: [facility];  Service: Plastics;  Laterality: N/A;    No Known Allergies  Family History  Problem Relation Age of Onset  . Hyperlipidemia Other        family hx  . Hyperlipidemia Mother   . Hypertension Mother   . Hyperthyroidism Mother   . Hyperlipidemia Father   . Colon polyps Father   . Colon cancer Neg Hx   . Esophageal cancer Neg Hx   . Rectal cancer Neg Hx   . Stomach cancer Neg Hx     Social History   Socioeconomic History  . Marital status: Married    Spouse name: Not on file  . Number of children:  2  . Years of education: Not on file  . Highest education level: Not on file  Occupational History  . Not on file  Social Needs  . Financial resource strain: Not on file  . Food insecurity    Worry: Not on file    Inability: Not on file  . Transportation needs    Medical: Not on file    Non-medical: Not on file  Tobacco Use  . Smoking status: Former Research scientist (life sciences)  . Smokeless tobacco: Never Used  . Tobacco comment: social in school   Substance and Sexual Activity  . Alcohol use: Yes    Alcohol/week: 5.0 standard drinks    Types: 3 Standard drinks or equivalent, 2 Cans of beer per week    Comment: 2 glasses wine/week  . Drug use: No  . Sexual activity: Not on file  Lifestyle  . Physical activity    Days per week: Not on file    Minutes per session: Not on file  . Stress: Not on file  Relationships  . Social Herbalist on phone: Not on file    Gets together: Not on file    Attends religious service: Not on file    Active member of club or  organization: Not on file    Attends meetings of clubs or organizations: Not on file    Relationship status: Not on file  Other Topics Concern  . Not on file  Social History Narrative   Lives with wife and 2 children in a 2 story home.     Works as a Freight forwarder for a Social worker.     Education: college.     Vitals:   08/29/19 0923 08/29/19 0958  BP: (!) 140/100 (!) 160/100  Pulse: 88   Resp: 12   Temp: (!) 96.3 F (35.7 C)   SpO2: 97%    Body mass index is 27.06 kg/m.   Wt Readings from Last 3 Encounters:  08/29/19 188 lb 9.6 oz (85.5 kg)  04/05/19 196 lb (88.9 kg)  01/26/19 196 lb (88.9 kg)    Physical Exam  Nursing note and vitals reviewed. Constitutional: He is oriented to person, place, and time. He appears well-developed and well-nourished. No distress.  HENT:  Head: Atraumatic.  Right Ear: Tympanic membrane, external ear and ear canal normal.  Left Ear: Tympanic membrane, external ear and ear canal normal.  Mouth/Throat: Oropharynx is clear and moist and mucous membranes are normal.  Eyes: Pupils are equal, round, and reactive to light. Conjunctivae and EOM are normal.  Neck: Normal range of motion. No tracheal deviation present. No thyromegaly present.  Cardiovascular: Normal rate and regular rhythm.  No murmur heard. Pulses:      Dorsalis pedis pulses are 2+ on the right side and 2+ on the left side.  Respiratory: Effort normal and breath sounds normal. No respiratory distress.  GI: Soft. He exhibits no mass. There is no hepatomegaly. There is no abdominal tenderness.  Genitourinary:    Genitourinary Comments: Refused,no concerns.   Musculoskeletal:        General: No tenderness or edema.     Comments: No major deformities appreciated and no signs of synovitis.  Lymphadenopathy:    He has no cervical adenopathy.       Right: No supraclavicular adenopathy present.       Left: No supraclavicular adenopathy present.  Neurological: He is  alert and oriented to person, place, and time. He has normal strength. No  cranial nerve deficit or sensory deficit. Coordination and gait normal.  Reflex Scores:      Bicep reflexes are 2+ on the right side and 2+ on the left side.      Patellar reflexes are 2+ on the right side and 2+ on the left side. Skin: Skin is warm. No erythema.  Psychiatric: His mood appears anxious. Cognition and memory are normal.  Well groomed,good eye contact.    ASSESSMENT AND PLAN:  Derrick Medina was seen today for annual exam and follow-up.  Diagnoses and all orders for this visit:  Orders Placed This Encounter  Procedures  . CBC with Differential  . PSA(Must document that pt has been informed of limitations of PSA testing.)  . Lipid panel  . Comprehensive metabolic panel  . TSH  . Testosterone  . EKG 12-Lead   Lab Results  Component Value Date   PSA 0.69 08/29/2019   Lab Results  Component Value Date   TSH 0.66 08/29/2019    Lab Results  Component Value Date   TESTOSTERONE 1,062.13 (H) 08/29/2019   Lab Results  Component Value Date   WBC 7.6 08/29/2019   HGB 17.9 (H) 08/29/2019   HCT 53.3 (H) 08/29/2019   MCV 90.9 08/29/2019   PLT 203.0 08/29/2019   Lab Results  Component Value Date   ALT 26 08/29/2019   AST 15 08/29/2019   ALKPHOS 53 08/29/2019   BILITOT 2.4 (H) 08/29/2019   Lab Results  Component Value Date   CREATININE 1.14 08/29/2019   BUN 16 08/29/2019   NA 139 08/29/2019   K 4.2 08/29/2019   CL 102 08/29/2019   CO2 29 08/29/2019   Lab Results  Component Value Date   CHOL 162 08/29/2019   HDL 47.10 08/29/2019   LDLCALC 98 08/29/2019   LDLDIRECT 155.8 12/14/2012   TRIG 88.0 08/29/2019   CHOLHDL 3 08/29/2019     Routine general medical examination at a health care facility We discussed the importance of regular physical activity and healthy diet for prevention of chronic illness and/or complications. Preventive guidelines reviewed. Vaccination up to date.   Next CPE in a year.  Chest tightness We discussed possible etiologies, it does not suggest cardiac etiology but explained that even though  the probability is low it is never zero. EKG done today: SR, normal axis and intervals, ? LAE, otherwise normal.No other EKG available for comparison. He agrees with holding on cardiology referral for now. Clearly instructed about warning signs.  -     EKG 12-Lead  Elevated hemoglobin (HCC) We discussed possible etiologies, most likely related with testosterone replacement.  Hypothyroidism, unspecified type No changes in current management, will follow labs done today and will give further recommendations accordingly.  Hypogonadism in male We discussed side effects of testosterone. He does not feel like medication is helping with ED, so I think he needs to consider discontinue it. Also some abnormalities in liver tests and CBC could be attributed to testosterone replacement. Further recommendation will be given according to lab results.  -     CBC with Differential -     Testosterone  Hyperlipidemia, unspecified hyperlipidemia type Continue atorvastatin 20 mg daily. Low-fat diet recommended. Further recommendation will be given according to lab results.  -     Lipid panel  Gastroesophageal reflux disease, unspecified whether esophagitis present This problem could be causing his chest discomfort. Recommend GERD precautions. He agrees with trying omeprazole 40 mg daily before breakfast. Instructed about warning signs. Follow-up  in 2 months.  -     omeprazole (PRILOSEC) 40 MG capsule; Take 1 capsule (40 mg total) by mouth daily.  Diabetes mellitus screening -     Comprehensive metabolic panel  Prostate cancer screening -     PSA(Must document that pt has been informed of limitations of PSA testing.)  Hypertension, essential, benign We discussed diagnostic criteria for hypertension, he has had other elevated BPs. Recommend amlodipine  5 mg daily at bedtime. Instructed to monitor BP regularly. Low-salt diet recommended. Follow-up in 2 months.  -     amLODipine (NORVASC) 5 MG tablet; Take 1 tablet (5 mg total) by mouth daily.  Skin sensation disturbance We discussed possible etiologies, polycythemia is to be considered. Problem may improve if testosterone replacement is discontinuing and CBC numbers normalized.  ?  Allergic, he could also try OTC cetirizine 10 mg daily for 3 to 4 weeks and monitor for changes.  Continue to follow with PCP.   Return in 2 months (on 10/29/2019) for PCP.    Carloyn Lahue G. [place], MD  [facility]. [place] office.

## 2019-08-31 ENCOUNTER — Encounter: Payer: Self-pay | Admitting: Family Medicine

## 2019-08-31 MED ORDER — ATORVASTATIN CALCIUM 20 MG PO TABS: ORAL_TABLET | ORAL | 3 refills | Status: DC

## 2019-08-31 MED ORDER — LEVOTHYROXINE SODIUM 175 MCG PO TABS: 175.0000 ug | ORAL_TABLET | Freq: Every day | ORAL | 3 refills | Status: DC

## 2019-09-01 ENCOUNTER — Encounter (HOSPITAL_COMMUNITY): Payer: Self-pay | Admitting: Emergency Medicine

## 2019-09-01 ENCOUNTER — Emergency Department (HOSPITAL_COMMUNITY): Payer: BC Managed Care – PPO

## 2019-09-01 ENCOUNTER — Observation Stay (HOSPITAL_COMMUNITY)
Admission: EM | Admit: 2019-09-01 | Discharge: 2019-09-02 | Disposition: A | Discharge status: Home / Self Care | Payer: BC Managed Care – PPO | Attending: Internal Medicine | Admitting: Internal Medicine

## 2019-09-01 DIAGNOSIS — Z79899 Other long term (current) drug therapy: Secondary | ICD-10-CM | POA: Insufficient documentation

## 2019-09-01 DIAGNOSIS — G43909 Migraine, unspecified, not intractable, without status migrainosus: Secondary | ICD-10-CM | POA: Insufficient documentation

## 2019-09-01 DIAGNOSIS — E039 Hypothyroidism, unspecified: Secondary | ICD-10-CM | POA: Diagnosis present

## 2019-09-01 DIAGNOSIS — K219 Gastro-esophageal reflux disease without esophagitis: Secondary | ICD-10-CM | POA: Insufficient documentation

## 2019-09-01 DIAGNOSIS — E785 Hyperlipidemia, unspecified: Secondary | ICD-10-CM | POA: Diagnosis not present

## 2019-09-01 DIAGNOSIS — Z87891 Personal history of nicotine dependence: Secondary | ICD-10-CM | POA: Diagnosis not present

## 2019-09-01 DIAGNOSIS — R9431 Abnormal electrocardiogram [ECG] [EKG]: Secondary | ICD-10-CM | POA: Diagnosis not present

## 2019-09-01 DIAGNOSIS — I10 Essential (primary) hypertension: Secondary | ICD-10-CM | POA: Diagnosis not present

## 2019-09-01 DIAGNOSIS — Z20828 Contact with and (suspected) exposure to other viral communicable diseases: Secondary | ICD-10-CM | POA: Insufficient documentation

## 2019-09-01 DIAGNOSIS — F1010 Alcohol abuse, uncomplicated: Secondary | ICD-10-CM | POA: Diagnosis present

## 2019-09-01 DIAGNOSIS — R079 Chest pain, unspecified: Secondary | ICD-10-CM | POA: Diagnosis not present

## 2019-09-01 DIAGNOSIS — Z79890 Hormone replacement therapy (postmenopausal): Secondary | ICD-10-CM | POA: Insufficient documentation

## 2019-09-01 DIAGNOSIS — R0602 Shortness of breath: Secondary | ICD-10-CM | POA: Diagnosis not present

## 2019-09-01 DIAGNOSIS — I200 Unstable angina: Secondary | ICD-10-CM | POA: Diagnosis present

## 2019-09-01 DIAGNOSIS — R072 Precordial pain: Secondary | ICD-10-CM

## 2019-09-01 HISTORY — DX: Essential (primary) hypertension: I10

## 2019-09-01 LAB — BASIC METABOLIC PANEL
Anion gap: 13 (ref 5–15)
BUN: 11 mg/dL (ref 6–20)
CO2: 25 mmol/L (ref 22–32)
Calcium: 9.7 mg/dL (ref 8.9–10.3)
Chloride: 101 mmol/L (ref 98–111)
Creatinine, Ser: 1.19 mg/dL (ref 0.61–1.24)
GFR calc Af Amer: >60 mL/min (ref >60)
GFR calc non Af Amer: >60 mL/min (ref >60)
Glucose, Bld: 91 mg/dL (ref 70–99)
Potassium: 3.5 mmol/L (ref 3.5–5.1)
Sodium: 139 mmol/L (ref 135–145)

## 2019-09-01 LAB — TROPONIN I (HIGH SENSITIVITY)
Troponin I (High Sensitivity): 4 ng/L (ref <18)
Troponin I (High Sensitivity): 5 ng/L (ref <18)

## 2019-09-01 LAB — CBC
HCT: 57 % — ABNORMAL HIGH (ref 39.0–52.0)
Hemoglobin: 19 g/dL — ABNORMAL HIGH (ref 13.0–17.0)
MCH: 30 pg (ref 26.0–34.0)
MCHC: 33.3 g/dL (ref 30.0–36.0)
MCV: 90 fL (ref 80.0–100.0)
Platelets: 255 10*3/uL (ref 150–400)
RBC: 6.33 MIL/uL — ABNORMAL HIGH (ref 4.22–5.81)
RDW: 12.5 % (ref 11.5–15.5)
WBC: 9.9 10*3/uL (ref 4.0–10.5)
nRBC: 0 % (ref 0.0–0.2)

## 2019-09-01 MED ORDER — SODIUM CHLORIDE 0.9% FLUSH: 3.0000 mL | Freq: Once | INTRAVENOUS | Status: DC

## 2019-09-01 NOTE — ED Triage Notes (Signed)
Pt reports frequent travel by car for work

## 2019-09-01 NOTE — ED Triage Notes (Signed)
Pt in with c/o central cp and sob. States sob x 2 wks, chest tightness began 3 wks ago. States tingling in L cheek began yesterday. Went to PCP Tuesday and had normal EKG. Denies any n/v, cough or dizziness. Has had problems with "throat burning" x 1 wk

## 2019-09-02 ENCOUNTER — Other Ambulatory Visit: Payer: Self-pay

## 2019-09-02 ENCOUNTER — Ambulatory Visit: Payer: BC Managed Care – PPO | Admitting: Family Medicine

## 2019-09-02 ENCOUNTER — Observation Stay (HOSPITAL_COMMUNITY): Payer: BC Managed Care – PPO

## 2019-09-02 ENCOUNTER — Encounter (HOSPITAL_COMMUNITY): Payer: Self-pay | Admitting: Internal Medicine

## 2019-09-02 DIAGNOSIS — F1010 Alcohol abuse, uncomplicated: Secondary | ICD-10-CM | POA: Diagnosis present

## 2019-09-02 DIAGNOSIS — R079 Chest pain, unspecified: Secondary | ICD-10-CM | POA: Diagnosis not present

## 2019-09-02 DIAGNOSIS — E039 Hypothyroidism, unspecified: Secondary | ICD-10-CM

## 2019-09-02 DIAGNOSIS — I200 Unstable angina: Secondary | ICD-10-CM | POA: Diagnosis not present

## 2019-09-02 DIAGNOSIS — I10 Essential (primary) hypertension: Secondary | ICD-10-CM

## 2019-09-02 DIAGNOSIS — E785 Hyperlipidemia, unspecified: Secondary | ICD-10-CM

## 2019-09-02 LAB — SARS CORONAVIRUS 2 (TAT 6-24 HRS): SARS Coronavirus 2: NEGATIVE

## 2019-09-02 LAB — HIV ANTIBODY (ROUTINE TESTING W REFLEX): HIV Screen 4th Generation wRfx: NONREACTIVE

## 2019-09-02 MED ORDER — ASPIRIN EC 81 MG PO TBEC: 81.0000 mg | DELAYED_RELEASE_TABLET | Freq: Every day | ORAL | 0 refills | Status: DC

## 2019-09-02 MED ORDER — ACETAMINOPHEN 325 MG PO TABS: 650.0000 mg | ORAL_TABLET | ORAL | Status: DC | PRN

## 2019-09-02 MED ORDER — ASPIRIN 81 MG PO CHEW
324.0000 mg | CHEWABLE_TABLET | Freq: Once | ORAL | Status: AC
  Administered 2019-09-02: 324 mg via ORAL
  Filled 2019-09-02: qty 4

## 2019-09-02 MED ORDER — NITROGLYCERIN 0.4 MG SL SUBL
SUBLINGUAL_TABLET | SUBLINGUAL | Status: AC
  Filled 2019-09-02: qty 2

## 2019-09-02 MED ORDER — LACTATED RINGERS IV SOLN
INTRAVENOUS | Status: DC
  Administered 2019-09-02: 11:00:00 via INTRAVENOUS

## 2019-09-02 MED ORDER — ONDANSETRON HCL 4 MG/2ML IJ SOLN: 4.0000 mg | Freq: Four times a day (QID) | INTRAMUSCULAR | Status: DC | PRN

## 2019-09-02 MED ORDER — PANTOPRAZOLE SODIUM 40 MG PO TBEC
40.0000 mg | DELAYED_RELEASE_TABLET | Freq: Every day | ORAL | Status: DC
  Administered 2019-09-02: 40 mg via ORAL
  Filled 2019-09-02: qty 1

## 2019-09-02 MED ORDER — METOPROLOL TARTRATE 25 MG PO TABS: 12.5000 mg | ORAL_TABLET | Freq: Two times a day (BID) | ORAL | 0 refills | Status: DC

## 2019-09-02 MED ORDER — IOHEXOL 350 MG/ML SOLN
80.0000 mL | Freq: Once | INTRAVENOUS | Status: AC | PRN
  Administered 2019-09-02: 80 mL via INTRAVENOUS

## 2019-09-02 MED ORDER — METOPROLOL TARTRATE 25 MG PO TABS
100.0000 mg | ORAL_TABLET | ORAL | Status: AC
  Administered 2019-09-02: 100 mg via ORAL
  Filled 2019-09-02: qty 4

## 2019-09-02 MED ORDER — ATORVASTATIN CALCIUM 40 MG PO TABS: ORAL_TABLET | ORAL | 0 refills | Status: DC

## 2019-09-02 MED ORDER — ENOXAPARIN SODIUM 40 MG/0.4ML ~~LOC~~ SOLN
40.0000 mg | SUBCUTANEOUS | Status: DC
  Administered 2019-09-02: 40 mg via SUBCUTANEOUS
  Filled 2019-09-02: qty 0.4

## 2019-09-02 MED ORDER — ATORVASTATIN CALCIUM 40 MG PO TABS
40.0000 mg | ORAL_TABLET | Freq: Every day | ORAL | Status: DC
  Administered 2019-09-02: 40 mg via ORAL
  Filled 2019-09-02: qty 1

## 2019-09-02 MED ORDER — LEVOTHYROXINE SODIUM 75 MCG PO TABS: 175.0000 ug | ORAL_TABLET | Freq: Every day | ORAL | Status: DC

## 2019-09-02 MED ORDER — ATORVASTATIN CALCIUM 10 MG PO TABS: 20.0000 mg | ORAL_TABLET | Freq: Every day | ORAL | Status: DC

## 2019-09-02 MED ORDER — METOPROLOL TARTRATE 12.5 MG HALF TABLET
12.5000 mg | ORAL_TABLET | Freq: Two times a day (BID) | ORAL | Status: DC
  Administered 2019-09-02: 12.5 mg via ORAL
  Filled 2019-09-02: qty 1

## 2019-09-02 MED ORDER — AMLODIPINE BESYLATE 5 MG PO TABS: 5.0000 mg | ORAL_TABLET | Freq: Every day | ORAL | Status: DC

## 2019-09-02 NOTE — ED Provider Notes (Signed)
[facility] EMERGENCY DEPARTMENT Provider Note   CSN: [id number] Arrival date & time: 09/01/19  1543     History   Chief Complaint Chief Complaint  Patient presents with   Shortness of Breath   Chest Pain    HPI Derrick Medina is a 53 y.o. male.     Patient to ED for evaluation of chest tightness and SOB. Symptoms are present with activity during the day, and with supine position at night. No fever, cough, congestion, nausea, vomiting. Symptoms started about 3 weeks ago. During the last 2-3 days he has had a tingling sensation to the left jaw area. He reports being seen by his   The history is provided by the patient. No language interpreter was used.  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 Authorizing Provider  amLODipine (NORVASC) 5 MG tablet Take 1 tablet (5 mg total) by mouth daily. 08/29/19   Martinique, Betty G, MD  atorvastatin (LIPITOR) 20 MG tablet TAKE 1 TABLET BY MOUTH EVERY DAY **NEED OFFICE  VISIT** 08/31/19   Martinique, Betty G, MD  B-D 3CC LUER-LOK SYR 22GX1" 22G X 1" 3 ML MISC USE EVERY 14 DAYS AS DIRECTED 02/07/19   Laurey Morale, MD  levothyroxine (SYNTHROID) 175 MCG tablet Take 1 tablet (175 mcg total) by mouth daily. 08/31/19   Martinique, Betty G, MD  NEEDLE, DISP, 25 G 25G X 1-1/2" MISC 1 mL by Does not apply route every 14 (fourteen) days. 06/03/16   Laurey Morale, MD  omeprazole (PRILOSEC) 40 MG capsule Take 1 capsule (40 mg total) by mouth daily. 08/29/19   Martinique, Betty G, MD  propranolol ER (INDERAL LA) 60 MG 24 hr capsule Take 1 capsule (60 mg total) by mouth daily. 08/20/18   Laurey Morale, MD  SUMAtriptan (IMITREX) 50 MG tablet TAKE 1 TABLET EVERY 2 (TWO) HOURS AS NEEDED FOR MIGRAINE. MAY REPEAT IN 2 HOURS IF HEADACHE PERSISTS OR RECURS. 04/11/19   Laurey Morale, MD  Syringe, Disposable, 3 ML MISC 1 mL by Does not apply route every 14 (fourteen) days. 03/04/17   Laurey Morale, MD    Family History Family History  Problem Relation Age of Onset   Hyperlipidemia Other        family hx   Hyperlipidemia Mother    Hypertension Mother    Hyperthyroidism Mother    Hyperlipidemia Father    Colon polyps Father    Colon cancer  Neg Hx    Esophageal cancer Neg Hx    Rectal cancer Neg Hx    Stomach cancer Neg Hx     Social History Social History   Tobacco Use   Smoking status: Former Smoker   Smokeless tobacco: Never Used   Tobacco comment: social in school   Substance Use Topics   Alcohol use: Yes    Alcohol/week: 5.0 standard drinks    Types: 3 Standard drinks or equivalent, 2 Cans of beer per week    Comment: 2 glasses wine/week   Drug use: No     Allergies   Patient has no known allergies.   Review of Systems Review of Systems  Constitutional: Negative for chills and fever.  HENT: Negative.   Respiratory: Positive for shortness of breath.   Cardiovascular: Positive for chest pain.  Gastrointestinal: Negative.  Negative for abdominal pain  and nausea.  Musculoskeletal: Negative.   Skin: Negative.   Neurological: Negative.      Physical Exam Updated Vital Signs BP (!) 157/112    Pulse 93    Temp 98.2 F (36.8 C) (Oral)    Resp 10    SpO2 98%   Physical Exam Vitals signs and nursing note reviewed.  Constitutional:      Appearance: He is well-developed.  HENT:     Head: Normocephalic.  Neck:     Musculoskeletal: Normal range of motion and neck supple.  Cardiovascular:     Rate and Rhythm: Normal rate and regular rhythm.     Heart sounds: No murmur.  Pulmonary:     Effort: Pulmonary effort is normal.     Breath sounds: Normal breath sounds. No wheezing, rhonchi or rales.  Abdominal:     General: Bowel sounds are normal.     Palpations: Abdomen is soft.     Tenderness: There is no abdominal tenderness. There is no guarding or rebound.  Musculoskeletal: Normal range of motion.     Right lower leg: No edema.     Left lower leg: No edema.  Skin:    General: Skin is warm and dry.     Findings: No rash.  Neurological:     Mental Status: He is alert and oriented to person, place, and time.      ED Treatments / Results  Labs (all labs ordered are listed, but only abnormal results are displayed) Labs Reviewed  CBC - Abnormal; Notable for the following components:      Result Value   RBC 6.33 (*)    Hemoglobin 19.0 (*)    HCT 57.0 (*)    All other components within normal limits  SARS CORONAVIRUS 2 (TAT 6-24 HRS)  BASIC METABOLIC PANEL  TROPONIN I (HIGH SENSITIVITY)  TROPONIN I (HIGH SENSITIVITY)    EKG EKG Interpretation  Date/Time:  Thursday September 01 2019 17:00:29 EDT Ventricular Rate:  97 PR Interval:  162 QRS Duration: 86 QT Interval:  336 QTC Calculation: 426 R Axis:   70 Text Interpretation:  Normal sinus rhythm Septal infarct , age undetermined T wave abnormality, consider inferior ischemia with no old to compare  Abnormal ECG Confirmed by Pryor Curia [id number]) on 09/02/2019 5:25:56  AM   Radiology Dg Chest 2 View  Result Date: 09/01/2019 CLINICAL DATA:  Pt c/o generalized chest pain, SOB, and congestion x 2-3 days. Hx of heart murmur. Pt is a former smoker. EXAM: CHEST - 2 VIEW COMPARISON:  None. FINDINGS: The heart size and mediastinal contours are within normal limits.  Both lungs are clear. The visualized skeletal structures are unremarkable. IMPRESSION: No active cardiopulmonary disease. Electronically Signed   By: Nolon Nations M.D.   On: 09/01/2019 18:58    Procedures Procedures (including critical care time)  Medications Ordered in ED Medications  sodium chloride flush (NS) 0.9 % injection 3 mL (has no administration in time range)     Initial Impression / Assessment and Plan / ED Course  I have reviewed the triage vital signs and the nursing notes.  Pertinent labs & imaging results that were available during my care of the patient were reviewed by me and considered in my medical decision making (see chart for details).      Patient to ED with concerning symptoms of chest tightness, SOB x 2 weeks, now with tingling to the left jaw.    EKG here shows concerning findings of inferior t-wave abnormality. No EKG image to compare here but on chart review, when seen by Dr. Martinique in the office on 08/31/19 EKG interpretation from her note reads: "EKG done today: SR, normal axis and intervals, ? LAE, otherwise normal.No other EKG available for comparison."  Heart Score 4. Given the patient's risk factors, heart score, abnormal EKG, feel the patient will need admission for observation, consideration of provocative testing.   Patient updated on results and recommendation for admission.   Discussed with dr. Renetta Chalk who accepted the patient for admission.  Final Clinical Impressions(s) / ED Diagnoses   Final diagnoses:  None   1. Chest pain  ED Discharge Orders    None       Charlann Lange, PA-C 09/02/19 Allen, Delice Bison, DO 09/02/19  [phone]

## 2019-09-02 NOTE — H&P (Signed)
History and Physical    Derrick Medina [id number] DOB: [birth year] DOA: 09/01/2019  PCP: Laurey Morale, MD Consultants:  [facility] Plan - GI Patient coming from:  Home - lives with wife and 2 kids; NOK: Wife, [phone]  Chief Complaint: chest pain  HPI: Derrick Medina is a 53 y.o. male with medical history significant of hyperthyroidism s/p RAI in 2013; HTN; and HLD presenting with chest pain.  Over the last 3 weeks, he has had intermittent SOB and chest tightness.  It would wake him up from sleep at night, up to a couple of times a night.  It had been less common in 2-3 months leading up to it.  Yesterday, with tingling in his cheek, chest tightness, and SOB.  He called his nurse advice line and they suggested he call 911.  The symptoms fluctuate, a little more this week.  He travels weekly, was in [facility] yesterday.  Substernal CP.  Discomfort is more noticeable when he is walking around, improved with rest (although still present).  He feels it now but no distress, not "pain" per se.    He had his annual physical last Monday.  EKG "looked normal."  Dr. Martinique suspected GERD and started reflux medication without relief. He was also diagnosed with HTN Monday and started medication (had been elevated during colonoscopy as well prior).    ED Course:  Carryover, per Dr. Hal Hope:  53 year old recently started on antihypertensives presents with chest pain. Admit for chest pain rule out.  Review of Systems: As per HPI; otherwise review of systems reviewed and negative.   Ambulatory Status:   Ambulates without assistance  Past Medical History:  Diagnosis Date  . Heart murmur   . Hyperlipidemia   . Hypertension   . Hypothyroidism (acquired) 11/26/2011    had radioactive ablation on 01-30-12  . Migraines     Past Surgical History:  Procedure Laterality Date  . COLONOSCOPY  2006  . LIPOMA EXCISION N/A 10/06/2017   Procedure: EXCISION LIPOMA;  Surgeon: Youlanda Roys, MD;   Location: [facility];  Service: Plastics;  Laterality: N/A;    Social History   Socioeconomic History  . Marital status: Married    Spouse name: Not on file  . Number of children: 2  . Years of education: Not on file  . Highest education level: Not on file  Occupational History  . Occupation: process improvement  Social Needs  . Financial resource strain: Not on file  . Food insecurity    Worry: Not on file    Inability: Not on file  . Transportation needs    Medical: Not on file    Non-medical: Not on file  Tobacco Use  . Smoking status: Former Research scientist (life sciences)  . Smokeless tobacco: Never Used  . Tobacco comment: social in school   Substance and Sexual Activity  . Alcohol use: Yes    Alcohol/week: 5.0 standard drinks    Types: 2 Cans of beer, 3 Standard drinks or equivalent per week    Comment: drinks a lot due to work, concerned he may have a problem with ETOH  . Drug use: No  . Sexual activity: Not on file  Lifestyle  . Physical activity    Days per week: Not on file    Minutes per session: Not on file  . Stress: Not on file  Relationships  . Social Herbalist on phone: Not on file    Gets together: Not on file  Attends religious service: Not on file    Active member of club or organization: Not on file    Attends meetings of clubs or organizations: Not on file    Relationship status: Not on file  . Intimate partner violence    Fear of current or ex partner: Not on file    Emotionally abused: Not on file    Physically abused: Not on file    Forced sexual activity: Not on file  Other Topics Concern  . Not on file  Social History Narrative   Lives with wife and 2 children in a 2 story home.     Works as a Freight forwarder for a Social worker.     Education: college.    No Known Allergies  Family History  Problem Relation Age of Onset  . Hyperlipidemia Other        family hx  . Hyperlipidemia Mother   . Hypertension Mother   .  Hyperthyroidism Mother   . Hyperlipidemia Father   . Colon polyps Father   . Colon cancer Neg Hx   . Esophageal cancer Neg Hx   . Rectal cancer Neg Hx   . Stomach cancer Neg Hx   . CAD Neg Hx     Prior to Admission medications   Medication Sig Start Date End Date Taking? Authorizing Provider  amLODipine (NORVASC) 5 MG tablet Take 1 tablet (5 mg total) by mouth daily. 08/29/19   Martinique, Betty G, MD  atorvastatin (LIPITOR) 20 MG tablet TAKE 1 TABLET BY MOUTH EVERY DAY **NEED OFFICE VISIT** 08/31/19   Martinique, Betty G, MD  B-D 3CC LUER-LOK SYR 22GX1" 22G X 1" 3 ML MISC USE EVERY 14 DAYS AS DIRECTED 02/07/19   Laurey Morale, MD  levothyroxine (SYNTHROID) 175 MCG tablet Take 1 tablet (175 mcg total) by mouth daily. 08/31/19   Martinique, Betty G, MD  NEEDLE, DISP, 25 G 25G X 1-1/2" MISC 1 mL by Does not apply route every 14 (fourteen) days. 06/03/16   Laurey Morale, MD  omeprazole (PRILOSEC) 40 MG capsule Take 1 capsule (40 mg total) by mouth daily. 08/29/19   Martinique, Betty G, MD  propranolol ER (INDERAL LA) 60 MG 24 hr capsule Take 1 capsule (60 mg total) by mouth daily. 08/20/18   Laurey Morale, MD  SUMAtriptan (IMITREX) 50 MG tablet TAKE 1 TABLET EVERY 2 (TWO) HOURS AS NEEDED FOR MIGRAINE. MAY REPEAT IN 2 HOURS IF HEADACHE PERSISTS OR RECURS. 04/11/19   Laurey Morale, MD  Syringe, Disposable, 3 ML MISC 1 mL by Does not apply route every 14 (fourteen) days. 03/04/17   Laurey Morale, MD    Physical Exam: Vitals:   09/02/19 0715 09/02/19 0745 09/02/19 0800 09/02/19 0830  BP: (!) 134/95 (!) 139/100 (!) 136/95 (!) 134/102  Pulse: 78 74 75 78  Resp: 13 14    Temp:      TempSrc:      SpO2: 97% 96% 97% 100%     . General:  Appears calm and comfortable and is NAD . Eyes:  PERRL, EOMI, normal lids, iris . ENT:  grossly normal hearing, lips & tongue, mmm; appropriate dentition . Neck:  no LAD, masses or thyromegaly . Cardiovascular:  RRR, no m/r/g. No LE edema.  Marland Kitchen Respiratory:   CTA bilaterally  with no wheezes/rales/rhonchi.  Normal respiratory effort. . Abdomen:  soft, NT, ND, NABS . Back:   normal alignment, no CVAT . Skin:  no rash or  induration seen on limited exam . Musculoskeletal:  grossly normal tone BUE/BLE, good ROM, no bony abnormality . Psychiatric:  grossly normal mood and affect, speech fluent and appropriate, AOx3 . Neurologic:  CN 2-12 grossly intact, moves all extremities in coordinated fashion, sensation intact    Radiological Exams on Admission: Dg Chest 2 View  Result Date: 09/01/2019 CLINICAL DATA:  Pt c/o generalized chest pain, SOB, and congestion x 2-3 days. Hx of heart murmur. Pt is a former smoker. EXAM: CHEST - 2 VIEW COMPARISON:  None. FINDINGS: The heart size and mediastinal contours are within normal limits. Both lungs are clear. The visualized skeletal structures are unremarkable. IMPRESSION: No active cardiopulmonary disease. Electronically Signed   By: Nolon Nations M.D.   On: 09/01/2019 18:58    EKG: Independently reviewed.  NSR with rate 97; nonspecific ST changes with concern for ischemia but no STEMI   Labs on Admission: I have personally reviewed the available labs and imaging studies at the time of the admission.  Pertinent labs:   Normal BMP HS troponin 4, 5 WBC 9.9 HGB 19.0 COVID pending   Assessment/Plan Principal Problem:   Unstable angina (HCC) Active Problems:   Hyperlipidemia   Hypothyroidism (acquired)   Essential hypertension   Alcohol abuse   Unstable angina -Patient with substernal chest pain that came on at rest several months ago but has been increasing in frequency; substernal, worse with exertion, improved with rest -This pattern is concerning for [place] -CXR unremarkable.   -Initial cardiac enzymes negative.   -Normal EKG on 10/21; today's EKG with nonspecific ST changes, no apparent STEMI. -HEART Score is 5, including an elevated risk and need for observation.  -Will plan to observe on telemetry to  further evaluate for ACS.  -Repeat EKG in AM -Start ASA 81 mg daily -Cardiology consultation  -NPO for possible stress test/cath   HTN -Recently diagnosed (earlier this week) and started on Norvasc -Given concern for CAD, will hold Norvasc and start low-dose Lopressor 12.5 mg PO BID for now -He previously took Propranolol for migraines but has not needed in quite some time  HLD -Continue Lipitor, increase to 40 mg daily -Lipids were checked on 10/19 (TC 162, HDL 47, LDL 98, TG 88) so will not repeat at this time  Hypothyroidism -Normal TSH on 10/19 -Will continue Synthroid at current dose  ETOH abuse -Patient reports weekly travel with his work 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    DVT prophylaxis: Lovenox Code Status:  Full - confirmed with patient Family Communication: None present Disposition Plan:  Home once clinically improved Consults called: Cardiology  Admission status: It is my clinical opinion that referral for OBSERVATION is reasonable and necessary in this patient based on the above information provided. The aforementioned taken together are felt to place the patient at high risk for further clinical deterioration. However it is anticipated that the patient may be medically stable for discharge from the hospital within 24 to 48 hours.    Karmen Bongo MD [facility]   How to contact the [facility] Attending or Consulting provider [place] or covering provider during after hours [place], for this patient?  1. Check the care team in [facility]  and look for a) attending/consulting [place] provider listed and b) the [facility] team listed 2. Log into [email or web address] and use [place]'s universal  password to access. If you do not have the password, please contact the hospital operator. 3. Locate the [facility] provider you are looking for under [facility] and page to a number that you can be directly reached. 4. If you still have difficulty reaching the provider, please page the [facility] (Director on Call) for the Hospitalists listed on amion for assistance.   09/02/2019, 9:35 AM

## 2019-09-02 NOTE — Consult Note (Addendum)
Cardiology Consultation:   Patient ID: Derrick Medina MRN: [id number]; DOB: 09-17-1966  Admit date: 09/01/2019 Date of Consult: 09/02/2019  Primary Care Provider: Laurey Morale, MD Primary Cardiologist: Ena Dawley, MD  Primary Electrophysiologist:  None    Patient Profile:   Derrick Medina is a 53 y.o. male with a hx of hyperthyroidism s/p RAI (2013), recent testosterone replacement with elevated Hb and LFTs, hyperlipidemia, GERD, and recently diagnosed HTN who is being seen today for the evaluation of chest pain at the request of Dr. Lorin Mercy.  History of Present Illness:   Derrick Medina with the above past medical history recenlty saw jhis PCP on 08/29/19 and complained of chest pain. CP reviewed with PCP and felt to be noncardiac. EKG was nonischemic in the office. Pt decided to hold off on cardiology referral at that time. Started amlodipine for hypertension. Started PPI without relief in his CP.  He called PCP office again complaining of CP and they directed him to the ER. He presents to [facility] 09/02/19 with complaints of chest pain and SOB. He states he has been experiencing CP intermittently for the past 2-3 months. CP is described as a 2/10 and a pressure in the epigastric region that sometimes radiates to his throat. He denies N/V, diaphoresis, orthopnea, LE swelling, and syncope. The CP and SOB often occur at night. CP is relieved with a yawn or deep inspiration. The chest pain sometimes feels like he has a cold, but without congestion.  The CP as become nearly constant and daily, prompting an evaluation. He travels frequently with his job (by car). He eats out frequently with work and has stopped exercising about 1.5 years ago. 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   He is a nonsmoker, no DM, and no strong family history of heart disease in his parents or one brother. Lipid panel with LDL of 98. He was only recently diagnosed with hypertension and started on amlodipine.  Amlodipine has not helped his CP.  Of note, he does snore, has daytime fatigue, and describes non-restorative sleep. He has never had a sleep study.  He has two children, 66 yo daughter and 20 yo son at [facility]. His daughter and wife have suffered anxiety and depression throughout the pandemic. Daughter has been hospitalized recently at behavioral health. He describes being under a lot of stress at home.   Workup in the ER: CXR negative for acute processes. HS troponin x 2 negative EKG yesterday with TWI inferior leads, EKG today with sinus rhythm and early repolarization Hb 19, suspected side effect of testosterone supplements Normal renal function and electrolytes   Heart Pathway Score:     Past Medical History:  Diagnosis Date  . Heart murmur   . Hyperlipidemia   . Hypertension   . Hypothyroidism (acquired) 11/26/2011    had radioactive ablation on 01-30-12  . Migraines     Past Surgical History:  Procedure Laterality Date  . COLONOSCOPY  2006  . LIPOMA EXCISION N/A 10/06/2017   Procedure: EXCISION LIPOMA;  Surgeon: Youlanda Roys, MD;  Location: [facility];  Service: Plastics;  Laterality: N/A;     Home Medications:  Prior to Admission medications   Medication Sig Start Date End Date Taking? Authorizing Provider  amLODipine (NORVASC) 5 MG tablet Take 1 tablet (5 mg total) by mouth daily. 08/29/19  Yes Martinique, Betty G, MD  atorvastatin (LIPITOR) 20 MG tablet TAKE 1 TABLET BY MOUTH EVERY DAY **NEED OFFICE  VISIT** Patient taking differently: Take 20 mg by mouth daily.  08/31/19  Yes Martinique, Betty G, MD  levothyroxine (SYNTHROID) 175 MCG tablet Take 1 tablet (175 mcg total) by mouth daily. 08/31/19  Yes [place]ique, Betty G, MD  omeprazole (PRILOSEC) 40 MG capsule Take 1 capsule (40 mg total) by mouth daily. 08/29/19  Yes Martinique, Betty G, MD  propranolol ER (INDERAL LA) 60 MG 24 hr capsule Take 1 capsule (60 mg total) by mouth daily. 08/20/18  Yes Laurey Morale,  MD  SUMAtriptan (IMITREX) 50 MG tablet TAKE 1 TABLET EVERY 2 (TWO) HOURS AS NEEDED FOR MIGRAINE. MAY REPEAT IN 2 HOURS IF HEADACHE PERSISTS OR RECURS. Patient taking differently: Take 50 mg by mouth as needed for migraine.  04/11/19  Yes Laurey Morale, MD  B-D 3CC LUER-LOK SYR 22GX1" 22G X 1" 3 ML MISC USE EVERY 14 DAYS AS DIRECTED 02/07/19   Laurey Morale, MD  NEEDLE, DISP, 25 G 25G X 1-1/2" MISC 1 mL by Does not apply route every 14 (fourteen) days. 06/03/16   Laurey Morale, MD  Syringe, Disposable, 3 ML MISC 1 mL by Does not apply route every 14 (fourteen) days. 03/04/17   Laurey Morale, MD    Inpatient Medications: Scheduled Meds: . atorvastatin  40 mg Oral Daily  . enoxaparin (LOVENOX) injection  40 mg Subcutaneous Q24H  . [START ON 09/03/2019] levothyroxine  175 mcg Oral QAC breakfast  . metoprolol tartrate  12.5 mg Oral BID  . pantoprazole  40 mg Oral Daily   Continuous Infusions: . lactated ringers 75 mL/hr at 09/02/19 1056   PRN Meds: acetaminophen, ondansetron (ZOFRAN) IV  Allergies:   No Known Allergies  Social History:   Social History   Socioeconomic History  . Marital status: Married    Spouse name: Not on file  . Number of children: 2  . Years of education: Not on file  . Highest education level: Not on file  Occupational History  . Occupation: process improvement  Social Needs  . Financial resource strain: Not on file  . Food insecurity    Worry: Not on file    Inability: Not on file  . Transportation needs    Medical: Not on file    Non-medical: Not on file  Tobacco Use  . Smoking status: Former Research scientist (life sciences)  . Smokeless tobacco: Never Used  . Tobacco comment: social in school   Substance and Sexual Activity  . Alcohol use: Yes    Alcohol/week: 5.0 standard drinks    Types: 2 Cans of beer, 3 Standard drinks or equivalent per week    Comment: drinks a lot due to work, concerned he may have a problem with ETOH  . Drug use: No  . Sexual activity: Not on  file  Lifestyle  . Physical activity    Days per week: Not on file    Minutes per session: Not on file  . Stress: Not on file  Relationships  . Social Herbalist on phone: Not on file    Gets together: Not on file    Attends religious service: Not on file    Active member of club or organization: Not on file    Attends meetings of clubs or organizations: Not on file    Relationship status: Not on file  . Intimate partner violence    Fear of current or ex partner: Not on file    Emotionally abused: Not on file  Physically abused: Not on file    Forced sexual activity: Not on file  Other Topics Concern  . Not on file  Social History Narrative   Lives with wife and 2 children in a 2 story home.     Works as a Freight forwarder for a Social worker.     Education: college.    Family History:    Family History  Problem Relation Age of Onset  . Hyperlipidemia Other        family hx  . Hyperlipidemia Mother   . Hypertension Mother   . Hyperthyroidism Mother   . Hyperlipidemia Father   . Colon polyps Father   . Colon cancer Neg Hx   . Esophageal cancer Neg Hx   . Rectal cancer Neg Hx   . Stomach cancer Neg Hx   . CAD Neg Hx      ROS:  Please see the history of present illness.   All other ROS reviewed and negative.     Physical Exam/Data:   Vitals:   09/02/19 0930 09/02/19 0945 09/02/19 1035 09/02/19 1045  BP: (!) 136/98 127/90 (!) 134/99 (!) 133/98  Pulse: 89 82 86 73  Resp:      Temp:      TempSrc:      SpO2: 99% 94% 99% 100%   No intake or output data in the 24 hours ending 09/02/19 1135 Last 3 Weights 08/29/2019 04/05/2019 01/26/2019  Weight (lbs) 188 lb 9.6 oz 196 lb 196 lb  Weight (kg) 85.548 kg 88.905 kg 88.905 kg     There is no height or weight on file to calculate BMI.  General:  Well nourished, well developed, in no acute distress Lymph: no adenopathy Neck: no JVD Vascular: No carotid bruits Cardiac:  normal S1, S2; RRR; no  murmur  Lungs:  clear to auscultation bilaterally, no wheezing, rhonchi or rales  Abd: soft, nontender, no hepatomegaly  Ext: no edema Musculoskeletal:  No deformities, BUE and BLE strength normal and equal Skin: warm and dry  Neuro:  CNs 2-12 intact, no focal abnormalities noted Psych:  Normal affect   EKG:  The EKG was personally reviewed and demonstrates:  Sinus rhythm with HR 76, early repolarization Telemetry:  Telemetry was personally reviewed and demonstrates:  Sinus rhythm in the 70-80s  Relevant CV Studies:  none  Laboratory Data:  High Sensitivity Troponin:   Recent Labs  Lab 09/01/19 1612 09/01/19 2212  TROPONINIHS 4 5     Chemistry Recent Labs  Lab 08/29/19 1021 09/01/19 1612  NA 139 139  K 4.2 3.5  CL 102 101  CO2 29 25  GLUCOSE 96 91  BUN 16 11  CREATININE 1.14 1.19  CALCIUM 9.5 9.7  GFRNONAA  --  >60  GFRAA  --  >60  ANIONGAP  --  13    Recent Labs  Lab 08/29/19 1021  PROT 6.7  ALBUMIN 4.7  AST 15  ALT 26  ALKPHOS 53  BILITOT 2.4*   Hematology Recent Labs  Lab 08/29/19 1021 09/01/19 1612  WBC 7.6 9.9  RBC 5.86* 6.33*  HGB 17.9* 19.0*  HCT 53.3* 57.0*  MCV 90.9 90.0  MCH  --  30.0  MCHC 33.6 33.3  RDW 13.4 12.5  PLT 203.0 255   BNPNo results for input(s): BNP, PROBNP in the last 168 hours.  DDimer No results for input(s): DDIMER in the last 168 hours.   Radiology/Studies:  Dg Chest 2 View  Result Date: 09/01/2019 CLINICAL  DATA:  Pt c/o generalized chest pain, SOB, and congestion x 2-3 days. Hx of heart murmur. Pt is a former smoker. EXAM: CHEST - 2 VIEW COMPARISON:  None. FINDINGS: The heart size and mediastinal contours are within normal limits. Both lungs are clear. The visualized skeletal structures are unremarkable. IMPRESSION: No active cardiopulmonary disease. Electronically Signed   By: Nolon Nations M.D.   On: 09/01/2019 18:58    Assessment and Plan:   1. Chest pain - hs troponin negative x 2 - EKG today  nonischemic; however, EKG in Epic from yesterday with TWI in inferior leads - CP described as constant, relieved with deep breathing, located in the epigastric region and radiates to his throat, nonexertional - given his typical and atypical symptoms, recommend evaluation with CT coronary - will need 100 mg lopressor to be given now - will need 18 g PIV in [facility] or higher  2. Hypertension - recently diagnosed and placed on 5 mg amlodipine - continue amlodipine   3. Hyperlipidemia 08/29/2019: Cholesterol 162; HDL 47.10; LDL Cholesterol 98; Triglycerides 88.0; VLDL 17.6 - encouraged diet and exercise - has not been exercising and has been eating out a lot with work - he is on 20 mg lipitor  4. GERD - PPI has not helped his chest pain  5. Suspect sleep apnea - he snores and has daytime fatigue - he does not wake up rested - would recommend sleep study OP    For questions or updates, please contact [place] Please consult [email or web address] for contact info under   Signed, Ledora Bottcher, PA  09/02/2019 11:35 AM   The patient was seen, examined and discussed with Minette Brine , PA-C and I agree with the above.   53 y.o. male with a hx of hyperthyroidism s/p RAI (2013), recent testosterone replacement with elevated Hb and LFTs, hyperlipidemia, GERD, and recently diagnosed HTN who is being seen today for the evaluation of chest pain. The  at the request of Dr. Lorin Mercy. The patient is non-exertional, occurring at night, in the epigastric area, in the past 2-3 months, sometimes radiates to his throat. Previously very active, not currently. ECG shows SR, early repolarization, non-specific ST T wave abnormalities. Troponin negative x 2. He is a nonsmoker, no DM, and no strong family history of heart disease in his parents or one brother. Lipid panel with LDL of 98. He was only recently diagnosed with hypertension and started on amlodipine.  We will arrange for coronary CTA and taylor  therapy based on results. Possible discharge from ER if non-obstructive  Ena Dawley, MD 09/02/2019   Addendum The patient has minimal coronary artery disease, we would recommend lifestyle changes with resuming his previous exercise regimen, continue statin, add aspirin 81 mg daily, continue amlodipine.  Decrease the amount of alcohol and limited to glass of wine a day.  The patient can be discharged today.  Please call us with any questions.  Ena Dawley, MD 09/02/2019

## 2019-09-02 NOTE — ED Notes (Signed)
ED TO INPATIENT HANDOFF REPORT  ED Nurse Name and Phone #: Celene Squibb RN  S Name/Age/Gender Derrick Medina 53 y.o. male Room/Bed: 036C/036C  Code Status   Code Status: Full Code  Home/SNF/Other Home Patient oriented to: self, place, time and situation Is this baseline? Yes   Triage Complete: Triage complete  Chief Complaint Chest Tightness, SOB, Numbness  Triage Note Pt in with c/o central cp and sob. States sob x 2 wks, chest tightness began 3 wks ago. States tingling in L cheek began yesterday. Went to PCP Tuesday and had normal EKG. Denies any n/v, cough or dizziness. Has had problems with "throat burning" x 1 wk  Pt reports frequent travel by car for work   Allergies No Known Allergies  Level of Care/Admitting Diagnosis ED Disposition    ED Disposition Condition [place]: [place] [[id number]]  Level of Care: Telemetry Cardiac [103]  I expect the patient will be discharged within 24 hours: Yes  LOW acuity---Tx typically complete <24 hrs---ACUTE conditions typically can be evaluated <24 hours---LABS likely to return to acceptable levels <24 hours---IS near functional baseline---EXPECTED to return to current living arrangement---NOT newly hypoxic: Meets criteria for 5C-Observation unit  Covid Evaluation: Asymptomatic Screening Protocol (No Symptoms)  Diagnosis: Chest pain [id number]  Admitting Physician: Karmen Bongo [[id number]]  Attending Physician: Karmen Bongo [[id number]]  PT Class (Do Not Modify): Observation [104]  PT Acc Code (Do Not Modify): Observation [10022]       B Medical/Surgery History Past Medical History:  Diagnosis Date  . Heart murmur   . Hyperlipidemia   . Hypertension   . Hypothyroidism (acquired) 11/26/2011    had radioactive ablation on 01-30-12  . Migraines    Past Surgical History:  Procedure Laterality Date  . COLONOSCOPY  2006  . LIPOMA EXCISION N/A 10/06/2017   Procedure: EXCISION LIPOMA;   Surgeon: Youlanda Roys, MD;  Location: [place];  Service: Plastics;  Laterality: N/A;     A IV Location/Drains/Wounds Patient Lines/Drains/Airways Status   Active Line/Drains/Airways    Name:   Placement date:   Placement time:   Site:   Days:   Peripheral IV 09/02/19 Right Antecubital   09/02/19    0549    Antecubital   less than 1   Peripheral IV 09/02/19 Left Antecubital   09/02/19    1338    Antecubital   less than 1   Incision (Closed) 10/06/17 Face   10/06/17    0827     696          Intake/Output Last 24 hours No intake or output data in the 24 hours ending 09/02/19 1441  Labs/Imaging Results for orders placed or performed during the hospital encounter of 09/01/19 (from the past 48 hour(s))  Basic metabolic panel     Status: None   Collection Time: 09/01/19  4:12 PM  Result Value Ref Range   Sodium 139 135 - 145 mmol/L   Potassium 3.5 3.5 - 5.1 mmol/L   Chloride 101 98 - 111 mmol/L   CO2 25 22 - 32 mmol/L   Glucose, Bld 91 70 - 99 mg/dL   BUN 11 6 - 20 mg/dL   Creatinine, Ser 1.19 0.61 - 1.24 mg/dL   Calcium 9.7 8.9 - 10.3 mg/dL   GFR calc non Af Amer >60 >60 mL/min   GFR calc Af Amer >60 >60 mL/min   Anion gap 13 5 - 15  Comment: Performed at [facility], [place] [address], [place], [place] [address]  CBC     Status: Abnormal   Collection Time: 09/01/19  4:12 PM  Result Value Ref Range   WBC 9.9 4.0 - 10.5 K/uL   RBC 6.33 (H) 4.22 - 5.81 MIL/uL   Hemoglobin 19.0 (H) 13.0 - 17.0 g/dL   HCT 57.0 (H) 39.0 - 52.0 %   MCV 90.0 80.0 - 100.0 fL   MCH 30.0 26.0 - 34.0 pg   MCHC 33.3 30.0 - 36.0 g/dL   RDW 12.5 11.5 - 15.5 %   Platelets 255 150 - 400 K/uL   nRBC 0.0 0.0 - 0.2 %    Comment: Performed at [facility], [place] [address], [place], [place] [address]  Troponin I (High Sensitivity)     Status: None   Collection Time: 09/01/19  4:12 PM  Result Value Ref Range   Troponin I (High Sensitivity) 4 <18 ng/L    Comment:  (NOTE) Elevated high sensitivity troponin I (hsTnI) values and significant  changes across serial measurements may suggest ACS but many other  chronic and acute conditions are known to elevate hsTnI results.  Refer to the "Links" section for chest pain algorithms and additional  guidance. Performed at [facility], [place] [address], [place], [place] [address]   Troponin I (High Sensitivity)     Status: None   Collection Time: 09/01/19 10:12 PM  Result Value Ref Range   Troponin I (High Sensitivity) 5 <18 ng/L    Comment: (NOTE) Elevated high sensitivity troponin I (hsTnI) values and significant  changes across serial measurements may suggest ACS but many other  chronic and acute conditions are known to elevate hsTnI results.  Refer to the "Links" section for chest pain algorithms and additional  guidance. Performed at [facility], [place] [address] [place], [place] [address]   SARS CORONAVIRUS 2 (TAT 6-24 HRS) Nasopharyngeal Nasopharyngeal Swab     Status: None   Collection Time: 09/02/19  5:49 AM   Specimen: Nasopharyngeal Swab  Result Value Ref Range   SARS Coronavirus 2 NEGATIVE NEGATIVE    Comment: (NOTE) SARS-CoV-2 target nucleic acids are NOT DETECTED. The SARS-CoV-2 RNA is generally detectable in upper and lower respiratory specimens during the acute phase of infection. Negative results do not preclude SARS-CoV-2 infection, do not rule out co-infections with other pathogens, and should not be used as the sole basis for treatment or other patient management decisions. Negative results must be combined with clinical observations, patient history, and epidemiological information. The expected result is Negative. Fact Sheet for Patients: SugarRoll.be Fact Sheet for Healthcare Providers: https://www.woods-mathews.com/ This test is not yet approved or cleared by the [place] FDA and  has been authorized for detection  and/or diagnosis of SARS-CoV-2 by FDA under an Emergency Use Authorization (EUA). This EUA will remain  in effect (meaning this test can be used) for the duration of the COVID-19 declaration under Section 56 4(b)(1) of the Act, 21 U.S.C. section 360bbb-3(b)(1), unless the authorization is terminated or revoked sooner. Performed at [facility], [place] [address], [place], [place] [address]   HIV Antibody (routine testing w rflx)     Status: None   Collection Time: 09/02/19 10:16 AM  Result Value Ref Range   HIV Screen 4th Generation wRfx NON REACTIVE NON REACTIVE    Comment: Performed at [place] [address], [place], [place] [address]  Dg Chest 2 View  Result Date: 09/01/2019 CLINICAL DATA:  Pt c/o generalized chest pain, SOB, and congestion x 2-3 days. Hx of heart murmur. Pt is a former smoker. EXAM: CHEST - 2 VIEW COMPARISON:  None. FINDINGS: The heart size and mediastinal contours are within normal limits. Both lungs are clear. The visualized skeletal structures are unremarkable. IMPRESSION: No active cardiopulmonary disease. Electronically Signed   By: Nolon Nations M.D.   On: 09/01/2019 18:58    Pending Labs Unresulted Labs (From admission, onward)   None      Vitals/Pain Today's Vitals   09/02/19 1200 09/02/19 1215 09/02/19 1230 09/02/19 1245  BP: (!) 127/95 136/89 127/87 (!) 133/95  Pulse: 72 73 78 68  Resp:      Temp:      TempSrc:      SpO2: 93% 98% 97% 99%  PainSc:        Isolation Precautions No active isolations  Medications Medications  levothyroxine (SYNTHROID) tablet 175 mcg (has no administration in time range)  pantoprazole (PROTONIX) EC tablet 40 mg (40 mg Oral Given 09/02/19 1052)  enoxaparin (LOVENOX) injection 40 mg (40 mg Subcutaneous Given 09/02/19 1053)  acetaminophen (TYLENOL) tablet 650 mg (has no administration in time range)  ondansetron (ZOFRAN) injection 4 mg (has no administration in time range)  lactated ringers  infusion ( Intravenous New Bag/Given 09/02/19 1056)  atorvastatin (LIPITOR) tablet 40 mg (40 mg Oral Given 09/02/19 1052)  metoprolol tartrate (LOPRESSOR) tablet 12.5 mg (12.5 mg Oral Given 09/02/19 1053)  nitroGLYCERIN (NITROSTAT) 0.4 MG SL tablet (has no administration in time range)  aspirin chewable tablet 324 mg (324 mg Oral Given 09/02/19 0558)  metoprolol tartrate (LOPRESSOR) tablet 100 mg (100 mg Oral Given 09/02/19 1245)    Mobility walks     Focused Assessments Cardiac Assessment Handoff:  Cardiac Rhythm: Normal sinus rhythm No results found for: CKTOTAL, CKMB, CKMBINDEX, TROPONINI No results found for: DDIMER Does the Patient currently have chest pain? No      R Recommendations: See Admitting Provider Note  Report given to:   Additional Notes:

## 2019-09-02 NOTE — Plan of Care (Signed)

## 2019-09-02 NOTE — ED Notes (Signed)
Pt moved to room 36 while holding.  State pain is minimal at rest.  No outward resp distress or pain responses.  Alert and oriented, talkative and appropriate.

## 2019-09-02 NOTE — Discharge Summary (Signed)
Discharge Summary  Derrick Medina [id number] DOB: 1965-12-11  PCP: Laurey Morale, MD  Admit date: 09/01/2019 Discharge date: 09/02/2019   Time spent: 35 minutes  Admitted From: Home Disposition:  Home  Recommendations for Outpatient Follow-up:  1. Follow up with PCP in 1-2 weeks    Discharge Diagnoses:  Active Hospital Problems   Diagnosis Date Noted   Unstable angina ([place]) 09/02/2019   Essential hypertension 09/02/2019   Alcohol abuse 09/02/2019   Hypothyroidism (acquired) 06/17/2012   Hyperlipidemia 02/16/2008    Resolved Hospital Problems  No resolved problems to display.    Discharge Condition: Stable  CODE STATUS: Full Diet recommendation:  Heart healthy, limit alcohol to 1 glass/day   Vitals:   09/02/19 1245 09/02/19 1453  BP: (!) 133/95 133/86  Pulse: 68 70  Resp:  18  Temp:  98 F (36.7 C)  SpO2: 99% 100%    History of present illness:  Derrick Medina is a 53 y.o. year old male with medical history significant for hypothyroidism (hyperthyroidism s/p RAI in 2013); HTN; and HLD who presented on 09/01/2019 with chest pain concerning for unstable angina and was found to have mild nonobstructive CAD on coronary CTA. Remaining hospital course addressed in problem based format below:   Hospital Course:   Chest pain -Patient with substernal chest pain that came on at rest several months ago but has been increasing in frequency; substernal, worse with exertion, improved with rest -This pattern is concerning for [place] -CXR unremarkable.  -Initial cardiac enzymes negative.  -Normal EKG on 10/21; today's EKG with nonspecific ST changes, no apparent STEMI; repeat with resolution of changes. -HEART Score is 5, including an elevated risk and need for observation.  -Patient observed and had coronary CTA which showed mild nonobstructive disease. -Start ASA 81 mg daily -Cardiology consultation done and recommended resumption of previous exercise regimen,  statin, ASA, and BP control.  HTN -Recently diagnosed (earlier this week) and started on Norvasc -Given concern for CAD,Norvascheld and patient started on low-dose Lopressor 12.5 mg PO BID for now. -He previously took Propranolol for migraines but has not needed in quite some time; given addition of Lopressor, he may not need propranolol  HLD -Continue Lipitor, increase to 40 mg daily -Lipids were checked on 10/19 (TC 162, HDL 47, LDL 98, TG 88) so will not repeat at this time but LDL >70 so statin increased.  Hypothyroidism -Normal TSH on 10/19 -Will continue Synthroid at current dose.  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   Consultations:  Cardiology  Procedures/Studies: Coronary CTA  Discharge Exam: BP 133/86 (BP Location: Right Arm)    Pulse 70    Temp 98 F (36.7 C) (Oral)    Resp 18    Ht 5\' 10"  (1.778 m)    Wt 85.5 kg    SpO2 100%    BMI 27.05 kg/m     Discharge Instructions Follow up: Please make an appointment to see your primary physician for follow up within 7 days of hospital discharge.  At that appointment:  -We routinely change or add medications that can affect your baseline labs and fluid status; therefore, you may require repeat blood work or tests during your next visit with your PCP.  Your PCP may decide not  to get them or may add new tests based on their clinical decision.  -Please get all medicines reviewed and adjusted.  -Please request that your primary physician go over all hospital tests and procedure/radiological results at the follow up.  Please get all hospital records sent to your physician by signing a hospital release before you go home.  Activity: As tolerated with fall  precautions.  Disposition: Home    Diet:   Heart Healthy.   For all patients - If you experience worsening of your admission symptoms or develop shortness of breath, life threatening emergency, suicidal or homicidal thoughts you must seek medical attention immediately by calling 911 or calling your MD immediately.  Read complete instructions along with all the possible side effects for all the medicines you take and that have been prescribed to you. Take any new medicines after you have completely understood and accept all the possible adverse reactions/side effects.   Do not drive, operate heavy machinery, perform activities at heights, swimming or participation in water activities or provide baby sitting services if your were admitted for syncope/seizures until you have seen by Primary MD/Neurologist and advised to do so.  Do not drive when taking pain medications.    Do not take more than prescribed pain, sleep and anxiety medications.  Special Instructions: If you have smoked or chewed Tobacco  in the last 2 yrs please stop smoking; also stop any regular Alcohol and/or any Recreational drug use including marijuana.  Wear Seat belts while driving.   Please note:  You were cared for by a hospitalist during your hospital stay. If you have any questions about your discharge medications or the care you received while you were in the hospital, you can call the unit and asked to speak with the hospitalist on call. Once you are discharged, your primary care physician will handle any further medical issues. Please note that NO REFILLS for any discharge medications will be authorized, as it is imperative that you return to your primary care physician (or establish a relationship with a primary care physician if you do not have one) for your aftercare needs so that they can reassess your need for medications and monitor your lab values.  Discharge Instructions    Call MD for:   Complete by: As  directed    Recurrent chest pain   Call MD for:  difficulty breathing, headache or visual disturbances   Complete by: As directed    Call MD for:  persistant dizziness or light-headedness   Complete by: As directed    Call MD for:  severe uncontrolled pain   Complete by: As directed    Diet - low sodium heart healthy   Complete by: As directed    Discharge instructions   Complete by: As directed    Follow up: Please make an appointment to see your primary physician for follow up within 7 days of hospital discharge.  At that appointment:  -We routinely change or add medications that can affect your baseline labs and fluid status; therefore, you may require repeat blood work or tests during your next visit with your PCP.  Your PCP may decide not to get them or may add new tests based on their clinical decision.  -Please get all medicines reviewed and adjusted.  -Please request that your primary physician go over all hospital tests and procedure/radiological results at the follow up.  Please get all hospital records sent to your physician by signing a hospital release before  you go home.  Activity: As tolerated with fall precautions; use walker/cane & assistance as needed.  Disposition: Home   Diet: Heart Healthy.  For Heart failure patients - Check your weight at the same time daily.  If you gain over 2 pounds, develop leg swelling, or experience more shortness of breath/chest pain, call your Primary MD immediately. Follow cardiac low salt diet with no more than 1.5 liters/day of fluid.  For all patients - If you experience worsening of your admission symptoms or develop shortness of breath, life threatening emergency, suicidal or homicidal thoughts you must seek medical attention immediately by calling 911 or calling your MD immediately.  Read complete instructions along with all the possible side effects for all the medicines you take and that have been prescribed to you. Take any new  medicines after you have completely understood and accept all the possible adverse reactions/side effects.   Do not drive, operate heavy machinery, perform activities at heights, swimming or participation in water activities or provide baby sitting services if your were admitted for syncope/seizures until you have seen by Primary MD/Neurologist and advised to do so.  Do not drive when taking pain medications.   Do not take more than prescribed pain, sleep and anxiety medications.  Special Instructions: If you have smoked or chewed Tobacco in the last 2 yrs please stop smoking; also stop any regular Alcohol and/or any Recreational drug use including marijuana.  Wear Seat belts while driving.  Please note:  You were cared for by a hospitalist during your hospital stay. If you have any questions about your discharge medications or the care you received while you were in the hospital, you can call the unit and asked to speak with the hospitalist on call. Once you are discharged, your primary care physician will handle any further medical issues. Please note that NO REFILLS for any discharge medications will be authorized, as it is imperative that you return to your primary care physician (or establish a relationship with a primary care physician if you do not have one) for your aftercare needs so that they can reassess your need for medications and monitor your lab values.   Increase activity slowly   Complete by: As directed      Allergies as of 09/02/2019   No Known Allergies     Medication List    STOP taking these medications   amLODipine 5 MG tablet Commonly known as: NORVASC   B-D 3CC LUER-LOK SYR 22GX1" 22G X 1" 3 ML Misc Generic drug: SYRINGE-NEEDLE (DISP) 3 ML   NEEDLE (DISP) 25 G 25G X 1-1/2" Misc   propranolol ER 60 MG 24 hr capsule Commonly known as: INDERAL LA   Syringe (Disposable) 3 ML Misc     TAKE these medications   aspirin EC 81 MG tablet Take 1 tablet (81 mg  total) by mouth daily.   atorvastatin 40 MG tablet Commonly known as: LIPITOR TAKE 1 TABLET BY MOUTH EVERY DAY **NEED OFFICE VISIT** What changed: medication strength   levothyroxine 175 MCG tablet Commonly known as: SYNTHROID Take 1 tablet (175 mcg total) by mouth daily.   metoprolol tartrate 25 MG tablet Commonly known as: LOPRESSOR Take 0.5 tablets (12.5 mg total) by mouth 2 (two) times daily.   omeprazole 40 MG capsule Commonly known as: PRILOSEC Take 1 capsule (40 mg total) by mouth daily.   SUMAtriptan 50 MG tablet Commonly known as: IMITREX TAKE 1 TABLET EVERY 2 (TWO) HOURS AS NEEDED FOR MIGRAINE.  MAY REPEAT IN 2 HOURS IF HEADACHE PERSISTS OR RECURS. What changed: See the new instructions.      No Known Allergies    The results of significant diagnostics from this hospitalization (including imaging, microbiology, ancillary and laboratory) are listed below for reference.    Significant Diagnostic Studies: Dg Chest 2 View  Result Date: 09/01/2019 CLINICAL DATA:  Pt c/o generalized chest pain, SOB, and congestion x 2-3 days. Hx of heart murmur. Pt is a former smoker. EXAM: CHEST - 2 VIEW COMPARISON:  None. FINDINGS: The heart size and mediastinal contours are within normal limits. Both lungs are clear. The visualized skeletal structures are unremarkable. IMPRESSION: No active cardiopulmonary disease. Electronically Signed   By: Nolon Nations M.D.   On: 09/01/2019 18:58   Ct Coronary Morph W/cta Cor W/score W/ca W/cm &/or Wo/cm  Addendum Date: 09/02/2019   ADDENDUM REPORT: 09/02/2019 16:30 EXAM: OVER-READ INTERPRETATION  CT CHEST The following report is an over-read performed by radiologist Dr. Samara Snide [facility] on 09/02/2019. This over-read does not include interpretation of cardiac or coronary anatomy or pathology. The coronary CTA interpretation by the cardiologist is attached. COMPARISON:  None. FINDINGS: Cardiovascular: Normal heart size. No  significant pericardial effusion/thickening. Great vessels are normal in course and caliber. No central pulmonary emboli. Mediastinum/Nodes: Unremarkable esophagus. No pathologically enlarged mediastinal or hilar lymph nodes. Lungs/Pleura: No pneumothorax. No pleural effusion. No acute consolidative airspace disease, lung masses or significant pulmonary nodules. Upper abdomen: No acute abnormality. Musculoskeletal:  No aggressive appearing focal osseous lesions. IMPRESSION: No significant extracardiac findings. Electronically Signed   By: Ilona Sorrel M.D.   On: 09/02/2019 16:30   Result Date: 09/02/2019 CLINICAL DATA:  53 year old male with h/o hypertension and chest pain. EXAM: Cardiac/Coronary  CTA TECHNIQUE: The patient was scanned on a [employer]. FINDINGS: A 100 kV prospective scan was triggered in the descending thoracic aorta at 111 HU's. Axial non-contrast 3 mm slices were carried out through the heart. The data set was analyzed on a dedicated work station and scored using the [place]. Gantry rotation speed was 250 msecs and collimation was .6 mm. 100 mg of PO metoprolol and 0.8 mg of sl NTG was given. The 3D data set was reconstructed in 5% intervals of the 67-82 % of the R-R cycle. Diastolic phases were analyzed on a dedicated work station using MPR, MIP and VRT modes. The patient received 80 cc of contrast. Aorta:  Normal size.  No calcifications.  No dissection. Aortic Valve:  Trileaflet.  No calcifications. Coronary Arteries:  Normal coronary origin.  Right dominance. RCA is a large dominant artery that gives rise to PDA and PLA. There is no plaque. Left main is a large artery that gives rise to LAD and LCX arteries. Left main has no plaque. LAD is a large vessel that gives rise to a large diagonal artery and has no plaque. LAD has trivial plaque in the proximal portion. LCX is a non-dominant artery that gives rise to one large OM1 branch. There is no plaque. Other findings:  Normal pulmonary vein drainage into the left atrium. Normal left atrial appendage without a thrombus. Normal size of the pulmonary artery. IMPRESSION: 1. Coronary calcium score of 1. This was 58 percentile for age and sex matched control. 2. Normal coronary origin with right dominance. 3. CAD-RADS 1. Minimal non-obstructive CAD (0-24%). Consider non-atherosclerotic causes of chest pain. Consider preventive therapy and risk factor modification. Electronically Signed: By: Ena Dawley On: 09/02/2019 14:56  Microbiology: Recent Results (from the past 240 hour(s))  SARS CORONAVIRUS 2 (TAT 6-24 HRS) Nasopharyngeal Nasopharyngeal Swab     Status: None   Collection Time: 09/02/19  5:49 AM   Specimen: Nasopharyngeal Swab  Result Value Ref Range Status   SARS Coronavirus 2 NEGATIVE NEGATIVE Final    Comment: (NOTE) SARS-CoV-2 target nucleic acids are NOT DETECTED. The SARS-CoV-2 RNA is generally detectable in upper and lower respiratory specimens during the acute phase of infection. Negative results do not preclude SARS-CoV-2 infection, do not rule out co-infections with other pathogens, and should not be used as the sole basis for treatment or other patient management decisions. Negative results must be combined with clinical observations, patient history, and epidemiological information. The expected result is Negative. Fact Sheet for Patients: SugarRoll.be Fact Sheet for Healthcare Providers: https://www.woods-mathews.com/ This test is not yet approved or cleared by the [place] FDA and  has been authorized for detection and/or diagnosis of SARS-CoV-2 by FDA under an Emergency Use Authorization (EUA). This EUA will remain  in effect (meaning this test can be used) for the duration of the COVID-19 declaration under Section 56 4(b)(1) of the Act, 21 U.S.C. section 360bbb-3(b)(1), unless the authorization is terminated or revoked  sooner. Performed at [facility], [place] [address], [place], [place] [address]      Labs: Basic Metabolic Panel: Recent Labs  Lab 08/29/19 1021 09/01/19 1612  NA 139 139  K 4.2 3.5  CL 102 101  CO2 29 25  GLUCOSE 96 91  BUN 16 11  CREATININE 1.14 1.19  CALCIUM 9.5 9.7   Liver Function Tests: Recent Labs  Lab 08/29/19 1021  AST 15  ALT 26  ALKPHOS 53  BILITOT 2.4*  PROT 6.7  ALBUMIN 4.7   No results for input(s): LIPASE, AMYLASE in the last 168 hours. No results for input(s): AMMONIA in the last 168 hours. CBC: Recent Labs  Lab 08/29/19 1021 09/01/19 1612  WBC 7.6 9.9  NEUTROABS 4.7  --   HGB 17.9* 19.0*  HCT 53.3* 57.0*  MCV 90.9 90.0  PLT 203.0 255   Cardiac Enzymes: No results for input(s): CKTOTAL, CKMB, CKMBINDEX, TROPONINI in the last 168 hours. BNP: BNP (last 3 results) No results for input(s): BNP in the last 8760 hours.  ProBNP (last 3 results) No results for input(s): PROBNP in the last 8760 hours.  CBG: No results for input(s): GLUCAP in the last 168 hours.     Signed:  Karmen Bongo, MD [facility] 09/02/2019, 5:13 PM

## 2019-09-06 ENCOUNTER — Encounter: Payer: Self-pay | Admitting: Family Medicine

## 2019-09-06 ENCOUNTER — Other Ambulatory Visit: Payer: Self-pay

## 2019-09-06 ENCOUNTER — Telehealth (INDEPENDENT_AMBULATORY_CARE_PROVIDER_SITE_OTHER): Payer: BC Managed Care – PPO | Admitting: Family Medicine

## 2019-09-06 DIAGNOSIS — R0789 Other chest pain: Secondary | ICD-10-CM

## 2019-09-06 DIAGNOSIS — E039 Hypothyroidism, unspecified: Secondary | ICD-10-CM

## 2019-09-06 DIAGNOSIS — I10 Essential (primary) hypertension: Secondary | ICD-10-CM | POA: Diagnosis not present

## 2019-09-06 DIAGNOSIS — F419 Anxiety disorder, unspecified: Secondary | ICD-10-CM

## 2019-09-06 DIAGNOSIS — F1010 Alcohol abuse, uncomplicated: Secondary | ICD-10-CM | POA: Diagnosis not present

## 2019-09-06 DIAGNOSIS — K219 Gastro-esophageal reflux disease without esophagitis: Secondary | ICD-10-CM

## 2019-09-06 MED ORDER — METOPROLOL SUCCINATE ER 50 MG PO TB24: 50.0000 mg | ORAL_TABLET | Freq: Every day | ORAL | 3 refills | Status: DC

## 2019-09-06 MED ORDER — ATORVASTATIN CALCIUM 20 MG PO TABS: 20.0000 mg | ORAL_TABLET | Freq: Every day | ORAL | 3 refills | Status: DC

## 2019-09-06 MED ORDER — PROPRANOLOL HCL ER 60 MG PO CP24: 60.0000 mg | ORAL_CAPSULE | Freq: Every day | ORAL | 3 refills | Status: DC | PRN

## 2019-09-06 MED ORDER — SUMATRIPTAN SUCCINATE 50 MG PO TABS: 50.0000 mg | ORAL_TABLET | ORAL | 5 refills | Status: DC | PRN

## 2019-09-06 NOTE — Progress Notes (Signed)
Virtual Visit via Video Note  I connected with the patient on 09/06/19 at  9:00 AM EDT by a video enabled telemedicine application and verified that I am speaking with the correct person using two identifiers.  Location patient: home Location provider:work or home office Persons participating in the virtual visit: patient, provider  I discussed the limitations of evaluation and management by telemedicine and the availability of in person appointments. The patient expressed understanding and agreed to proceed.   HPI: Here to follow up a hospital stay from 09-01-19 to 09-02-19 for chest pains. For the past 4 weeks he has had intermittent tightness in the chest an din the throat which is not related to exertion. Sometimes this occurs in bed at night. No trouble swallowing. No SOB. He has been taking Omeprazole 40 mg daily at bedtime. In the hospital his EKG and cardiac enzymes were normal. He had a coronary CTA which showed only mild nonobstructive CAD. He was started on Metoprolol bid and ASA 81 mg daily. Since his DC he has felt better but he still has some chest tightness. He has changed his lifestyle as well. 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 He has cut back substantially on the alcohol intake and is eating healthier foods. He wants to get back into an exercise routine also. His BP was borderline high in the hospital but he has not checked it since he got home. Finally he admits to dealing with a lot of stress in his life, from both job stressors and family issues.    ROS: See pertinent positives and negatives per HPI.  Past Medical History:  Diagnosis Date  . Heart murmur   . Hyperlipidemia   . Hypertension   . Hypothyroidism (acquired) 11/26/2011    had radioactive ablation on 01-30-12  . Migraines     Past Surgical History:  Procedure Laterality Date  . COLONOSCOPY  2006  . LIPOMA EXCISION N/A  10/06/2017   Procedure: EXCISION LIPOMA;  Surgeon: Youlanda Roys, MD;  Location: [facility];  Service: Plastics;  Laterality: N/A;    Family History  Problem Relation Age of Onset  . Hyperlipidemia Other        family hx  . Hyperlipidemia Mother   . Hypertension Mother   . Hyperthyroidism Mother   . Hyperlipidemia Father   . Colon polyps Father   . Colon cancer Neg Hx   . Esophageal cancer Neg Hx   . Rectal cancer Neg Hx   . Stomach cancer Neg Hx   . CAD Neg Hx      Current Outpatient Medications:  .  aspirin EC 81 MG tablet, Take 1 tablet (81 mg total) by mouth daily., Disp: 30 tablet, Rfl: 0 .  levothyroxine (SYNTHROID) 175 MCG tablet, Take 1 tablet (175 mcg total) by mouth daily., Disp: 90 tablet, Rfl: 3 .  omeprazole (PRILOSEC) 40 MG capsule, Take 1 capsule (40 mg total) by mouth daily., Disp: 30 capsule, Rfl: 3 .  SUMAtriptan (IMITREX) 50 MG tablet, Take 1 tablet (50 mg total) by mouth as needed for migraine. May repeat in 2 hours if headache persists or recurs., Disp: 30 tablet, Rfl: 5 .  atorvastatin (LIPITOR) 20 MG tablet, Take 1 tablet (20 mg total) by mouth daily., Disp: 90 tablet, Rfl: 3 .  metoprolol succinate (TOPROL-XL) 50 MG 24 hr tablet, Take 1 tablet (50 mg total) by mouth  daily. Take with or immediately following a meal., Disp: 90 tablet, Rfl: 3 .  propranolol ER (INDERAL LA) 60 MG 24 hr capsule, Take 1 capsule (60 mg total) by mouth daily as needed (migraines)., Disp: 90 capsule, Rfl: 3  EXAM:  VITALS per patient if applicable:  GENERAL: alert, oriented, appears well and in no acute distress  HEENT: atraumatic, conjunttiva clear, no obvious abnormalities on inspection of external nose and ears  NECK: normal movements of the head and neck  LUNGS: on inspection no signs of respiratory distress, breathing rate appears normal, no obvious gross SOB, gasping or wheezing  CV: no obvious cyanosis  MS: moves all visible extremities without  noticeable abnormality  PSYCH/NEURO: pleasant and cooperative, no obvious depression or anxiety, speech and thought processing grossly intact  ASSESSMENT AND PLAN: I think his chest tightness is primarily the result of GERD and stress. I advisedhim to move the Omeprazole to taking it in the mornings. His dietary changes should help also. I offered a medication to help with his anxiety but he declined, preferring to try exercise instead. For the BP I will switch him from Metoprolol tartrate to succinate 50 mg once daily in the mornings. He will stay on ASA 81 mg daily. He has stopped talking testosterone. I asked him to obtain a BP cuff so he can follow hte BP at home. He will follow up with me in 2 weeks.  Alysia Penna, MD  Discussed the following assessment and plan:  No diagnosis found.     I discussed the assessment and treatment plan with the patient. The patient was provided an opportunity to ask questions and all were answered. The patient agreed with the plan and demonstrated an understanding of the instructions.   The patient was advised to call back or seek an in-person evaluation if the symptoms worsen or if the condition fails to improve as anticipated.

## 2019-11-20 ENCOUNTER — Other Ambulatory Visit: Payer: Self-pay | Admitting: Family Medicine

## 2019-11-20 DIAGNOSIS — I10 Essential (primary) hypertension: Secondary | ICD-10-CM

## 2019-11-20 DIAGNOSIS — K219 Gastro-esophageal reflux disease without esophagitis: Secondary | ICD-10-CM

## 2019-11-21 NOTE — Telephone Encounter (Signed)
Amlodipine not on current med list. Please advise.

## 2020-01-13 ENCOUNTER — Ambulatory Visit: Payer: BC Managed Care – PPO | Attending: Internal Medicine

## 2020-01-13 ENCOUNTER — Ambulatory Visit: Payer: BC Managed Care – PPO

## 2020-01-13 DIAGNOSIS — Z23 Encounter for immunization: Secondary | ICD-10-CM

## 2020-01-13 NOTE — Progress Notes (Signed)
   Covid-19 Vaccination Clinic  Name:  Derrick Medina    MRN: [id number] DOB: 1966/02/26  01/13/2020  Derrick Medina was observed post Covid-19 immunization for 15 minutes without incident. He was provided with Vaccine Information Sheet and instruction to access the V-Safe system.   Derrick Medina was instructed to call 911 with any severe reactions post vaccine: Marland Kitchen Difficulty breathing  . Swelling of face and throat  . A fast heartbeat  . A bad rash all over body  . Dizziness and weakness

## 2020-02-06 ENCOUNTER — Ambulatory Visit: Payer: BC Managed Care – PPO | Attending: Internal Medicine

## 2020-02-06 DIAGNOSIS — Z23 Encounter for immunization: Secondary | ICD-10-CM

## 2020-02-06 NOTE — Progress Notes (Signed)
   Covid-19 Vaccination Clinic  Name:  Derrick Medina    MRN: [id number] DOB: 1966/05/16  02/06/2020  [name] was observed post Covid-19 immunization for 15 minutes without incident. He was provided with Vaccine Information Sheet and instruction to access the V-Safe system.   [name] was instructed to call 911 with any severe reactions post vaccine: Marland Kitchen Difficulty breathing  . Swelling of face and throat  . A fast heartbeat  . A bad rash all over body  . Dizziness and weakness   Immunizations Administered    Name Date Dose VIS Date Route   Pfizer COVID-19 Vaccine 02/06/2020  8:14 AM 0.3 mL 10/21/2019 Intramuscular   Manufacturer: [place]   Lot: [id number]   [place]: [id number]

## 2020-02-08 ENCOUNTER — Ambulatory Visit: Payer: BC Managed Care – PPO

## 2020-05-02 ENCOUNTER — Encounter: Payer: Self-pay | Admitting: Family Medicine

## 2020-05-02 MED ORDER — SUMATRIPTAN SUCCINATE 50 MG PO TABS: 50.0000 mg | ORAL_TABLET | ORAL | 5 refills | Status: DC | PRN

## 2020-08-02 ENCOUNTER — Other Ambulatory Visit: Payer: Self-pay | Admitting: Family Medicine

## 2020-08-28 ENCOUNTER — Other Ambulatory Visit: Payer: Self-pay | Admitting: Family Medicine

## 2020-09-13 ENCOUNTER — Other Ambulatory Visit: Payer: Self-pay | Admitting: Family Medicine

## 2020-09-17 ENCOUNTER — Encounter: Payer: BC Managed Care – PPO | Admitting: Family Medicine

## 2020-09-21 ENCOUNTER — Ambulatory Visit: Payer: BC Managed Care – PPO | Admitting: Family Medicine

## 2020-09-28 ENCOUNTER — Ambulatory Visit (INDEPENDENT_AMBULATORY_CARE_PROVIDER_SITE_OTHER): Payer: BC Managed Care – PPO | Admitting: Family Medicine

## 2020-09-28 ENCOUNTER — Encounter: Payer: Self-pay | Admitting: Family Medicine

## 2020-09-28 ENCOUNTER — Other Ambulatory Visit: Payer: Self-pay

## 2020-09-28 VITALS — BP 122/78 | HR 72 | Temp 97.9°F | Ht 70.0 in | Wt 186.2 lb

## 2020-09-28 DIAGNOSIS — Z0000 Encounter for general adult medical examination without abnormal findings: Secondary | ICD-10-CM | POA: Diagnosis not present

## 2020-09-28 DIAGNOSIS — E039 Hypothyroidism, unspecified: Secondary | ICD-10-CM | POA: Diagnosis not present

## 2020-09-28 DIAGNOSIS — Z23 Encounter for immunization: Secondary | ICD-10-CM | POA: Diagnosis not present

## 2020-09-28 MED ORDER — LEVOTHYROXINE SODIUM 175 MCG PO TABS: 175.0000 ug | ORAL_TABLET | Freq: Every day | ORAL | 3 refills | Status: DC

## 2020-09-28 MED ORDER — ATORVASTATIN CALCIUM 20 MG PO TABS: 20.0000 mg | ORAL_TABLET | Freq: Every day | ORAL | 3 refills | Status: DC

## 2020-09-28 MED ORDER — METOPROLOL SUCCINATE ER 50 MG PO TB24
ORAL_TABLET | ORAL | 3 refills | Status: DC
Start: 2020-09-28 — End: 2021-09-17

## 2020-09-28 NOTE — Progress Notes (Signed)
Subjective:    Patient ID: Derrick Medina, male    DOB: [birth year], 54 y.o.   MRN: [id number]  HPI Here for a well exam. He feels well. His GERD has settled down and he was able to stop taking Omeprazole. His BP has been well controlled.    Review of Systems  Constitutional: Negative.   HENT: Negative.   Eyes: Negative.   Respiratory: Negative.   Cardiovascular: Negative.   Gastrointestinal: Negative.   Genitourinary: Negative.   Musculoskeletal: Negative.   Skin: Negative.   Neurological: Negative.   Psychiatric/Behavioral: Negative.        Objective:   Physical Exam Constitutional:      General: He is not in acute distress.    Appearance: Normal appearance. He is well-developed. He is not diaphoretic.  HENT:     Head: Normocephalic and atraumatic.     Right Ear: External ear normal.     Left Ear: External ear normal.     Nose: Nose normal.     Mouth/Throat:     Pharynx: No oropharyngeal exudate.  Eyes:     General: No scleral icterus.       Right eye: No discharge.        Left eye: No discharge.     Conjunctiva/sclera: Conjunctivae normal.     Pupils: Pupils are equal, round, and reactive to light.  Neck:     Thyroid: No thyromegaly.     Vascular: No JVD.     Trachea: No tracheal deviation.  Cardiovascular:     Rate and Rhythm: Normal rate and regular rhythm.     Heart sounds: Normal heart sounds. No murmur heard.  No friction rub. No gallop.   Pulmonary:     Effort: Pulmonary effort is normal. No respiratory distress.     Breath sounds: Normal breath sounds. No wheezing or rales.  Chest:     Chest wall: No tenderness.  Abdominal:     General: Bowel sounds are normal. There is no distension.     Palpations: Abdomen is soft. There is no mass.     Tenderness: There is no abdominal tenderness. There is no guarding or rebound.  Genitourinary:    Penis: Normal. No tenderness.      Testes: Normal.     Prostate: Normal.     Rectum: Normal. Guaiac result  negative.     Comments: There is a small hydrocele at the superior pole of the right testicle  Musculoskeletal:        General: No tenderness. Normal range of motion.     Cervical back: Neck supple.  Lymphadenopathy:     Cervical: No cervical adenopathy.  Skin:    General: Skin is warm and dry.     Coloration: Skin is not pale.     Findings: No erythema or rash.  Neurological:     Mental Status: He is alert and oriented to person, place, and time.     Cranial Nerves: No cranial nerve deficit.     Motor: No abnormal muscle tone.     Coordination: Coordination normal.     Deep Tendon Reflexes: Reflexes are normal and symmetric. Reflexes normal.  Psychiatric:        Behavior: Behavior normal.        Thought Content: Thought content normal.        Judgment: Judgment normal.           Assessment & Plan:  Well exam. We discussed diet and exercise.  Get fasting labs. Stop the daily ASA.  Alysia Penna, MD

## 2020-09-29 LAB — T3, FREE: T3, Free: 4.5 pg/mL — ABNORMAL HIGH (ref 2.3–4.2)

## 2020-09-29 LAB — CBC WITH DIFFERENTIAL/PLATELET
Absolute Monocytes: 704 cells/uL (ref 200–950)
Basophils Absolute: 34 cells/uL (ref 0–200)
Basophils Relative: 0.5 %
Eosinophils Absolute: 181 cells/uL (ref 15–500)
Eosinophils Relative: 2.7 %
HCT: 46.8 % (ref 38.5–50.0)
Hemoglobin: 16 g/dL (ref 13.2–17.1)
Lymphs Abs: 2419 cells/uL (ref 850–3900)
MCH: 30.1 pg (ref 27.0–33.0)
MCHC: 34.2 g/dL (ref 32.0–36.0)
MCV: 88.1 fL (ref 80.0–100.0)
MPV: 11.3 fL (ref 7.5–12.5)
Monocytes Relative: 10.5 %
Neutro Abs: 3363 cells/uL (ref 1500–7800)
Neutrophils Relative %: 50.2 %
Platelets: 232 10*3/uL (ref 140–400)
RBC: 5.31 10*6/uL (ref 4.20–5.80)
RDW: 12.1 % (ref 11.0–15.0)
Total Lymphocyte: 36.1 %
WBC: 6.7 10*3/uL (ref 3.8–10.8)

## 2020-09-29 LAB — HEPATIC FUNCTION PANEL
AG Ratio: 2.1 (calc) (ref 1.0–2.5)
ALT: 35 U/L (ref 9–46)
AST: 22 U/L (ref 10–35)
Albumin: 4.6 g/dL (ref 3.6–5.1)
Alkaline phosphatase (APISO): 62 U/L (ref 35–144)
Bilirubin, Direct: 0.3 mg/dL — ABNORMAL HIGH (ref 0.0–0.2)
Globulin: 2.2 g/dL (calc) (ref 1.9–3.7)
Indirect Bilirubin: 1.2 mg/dL (calc) (ref 0.2–1.2)
Total Bilirubin: 1.5 mg/dL — ABNORMAL HIGH (ref 0.2–1.2)
Total Protein: 6.8 g/dL (ref 6.1–8.1)

## 2020-09-29 LAB — LIPID PANEL
Cholesterol: 159 mg/dL (ref <200)
HDL: 48 mg/dL (ref >=40)
LDL Cholesterol (Calc): 94 mg/dL (calc)
Non-HDL Cholesterol (Calc): 111 mg/dL (calc) (ref <130)
Total CHOL/HDL Ratio: 3.3 (calc) (ref <5.0)
Triglycerides: 80 mg/dL (ref <150)

## 2020-09-29 LAB — BASIC METABOLIC PANEL WITH GFR
BUN: 17 mg/dL (ref 7–25)
CO2: 29 mmol/L (ref 20–32)
Calcium: 9.9 mg/dL (ref 8.6–10.3)
Chloride: 103 mmol/L (ref 98–110)
Creat: 0.98 mg/dL (ref 0.70–1.33)
GFR, Est African American: 102 mL/min/{1.73_m2} (ref >=60)
GFR, Est Non African American: 88 mL/min/{1.73_m2} (ref >=60)
Glucose, Bld: 87 mg/dL (ref 65–99)
Potassium: 4.5 mmol/L (ref 3.5–5.3)
Sodium: 140 mmol/L (ref 135–146)

## 2020-09-29 LAB — T4, FREE: Free T4: 1.9 ng/dL — ABNORMAL HIGH (ref 0.8–1.8)

## 2020-09-29 LAB — TSH: TSH: <0.01 mIU/L — ABNORMAL LOW (ref 0.40–4.50)

## 2020-09-29 LAB — PSA: PSA: 0.52 ng/mL (ref <=4.0)

## 2020-10-02 ENCOUNTER — Other Ambulatory Visit: Payer: Self-pay

## 2020-10-02 MED ORDER — LEVOTHYROXINE SODIUM 150 MCG PO TABS: 150.0000 ug | ORAL_TABLET | Freq: Every day | ORAL | 3 refills | Status: DC

## 2020-10-09 ENCOUNTER — Encounter: Payer: Self-pay | Admitting: Family Medicine

## 2020-10-26 IMAGING — DX DG CHEST 2V
2 series · 2 of 2 positions shown · non-contrast
Comparison: None.

CLINICAL DATA: Pt c/o generalized chest pain, SOB, and congestion x
2-3 days. Hx of heart murmur. Pt is a former smoker.

EXAM:
CHEST - 2 VIEW

[w chest pa]
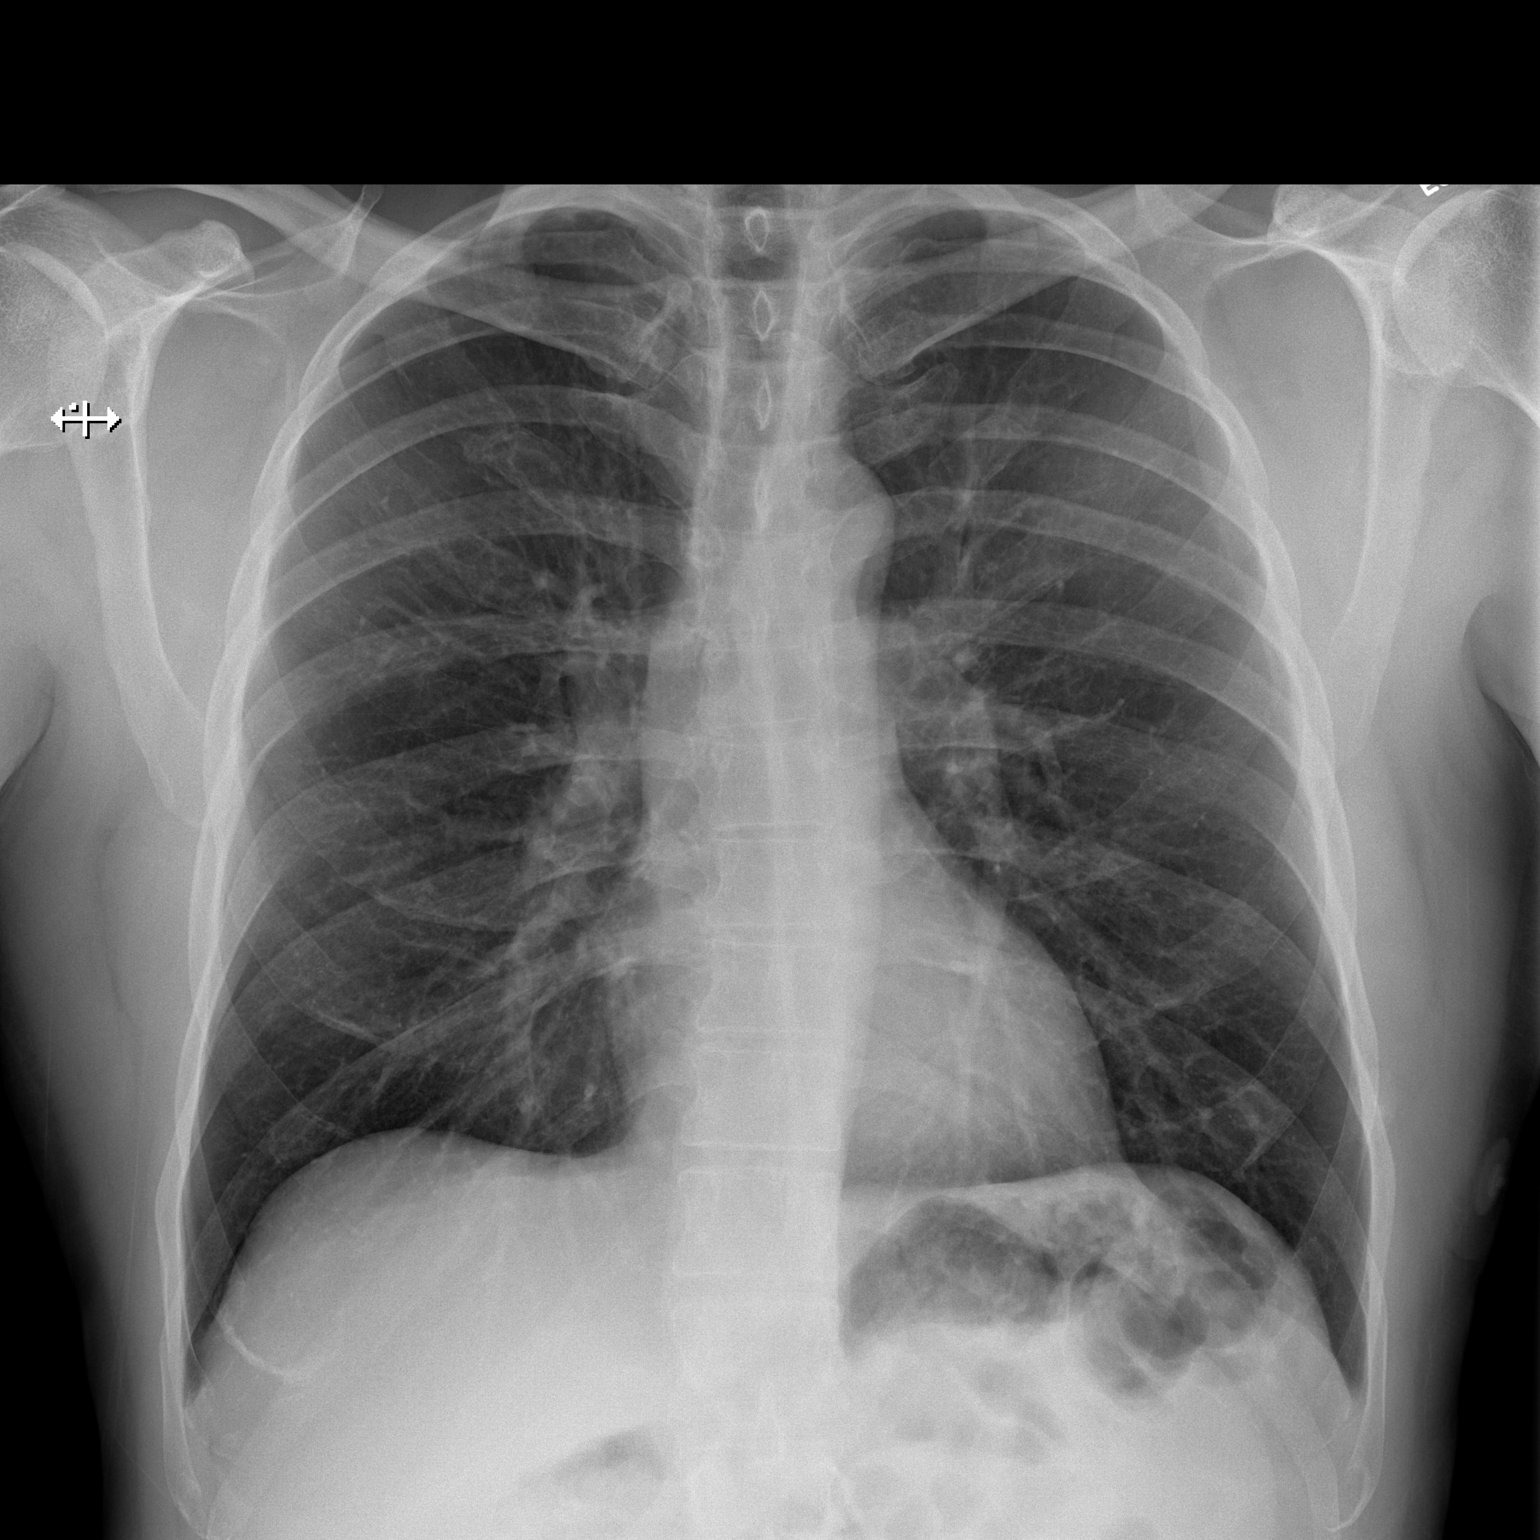

[w chest lat]
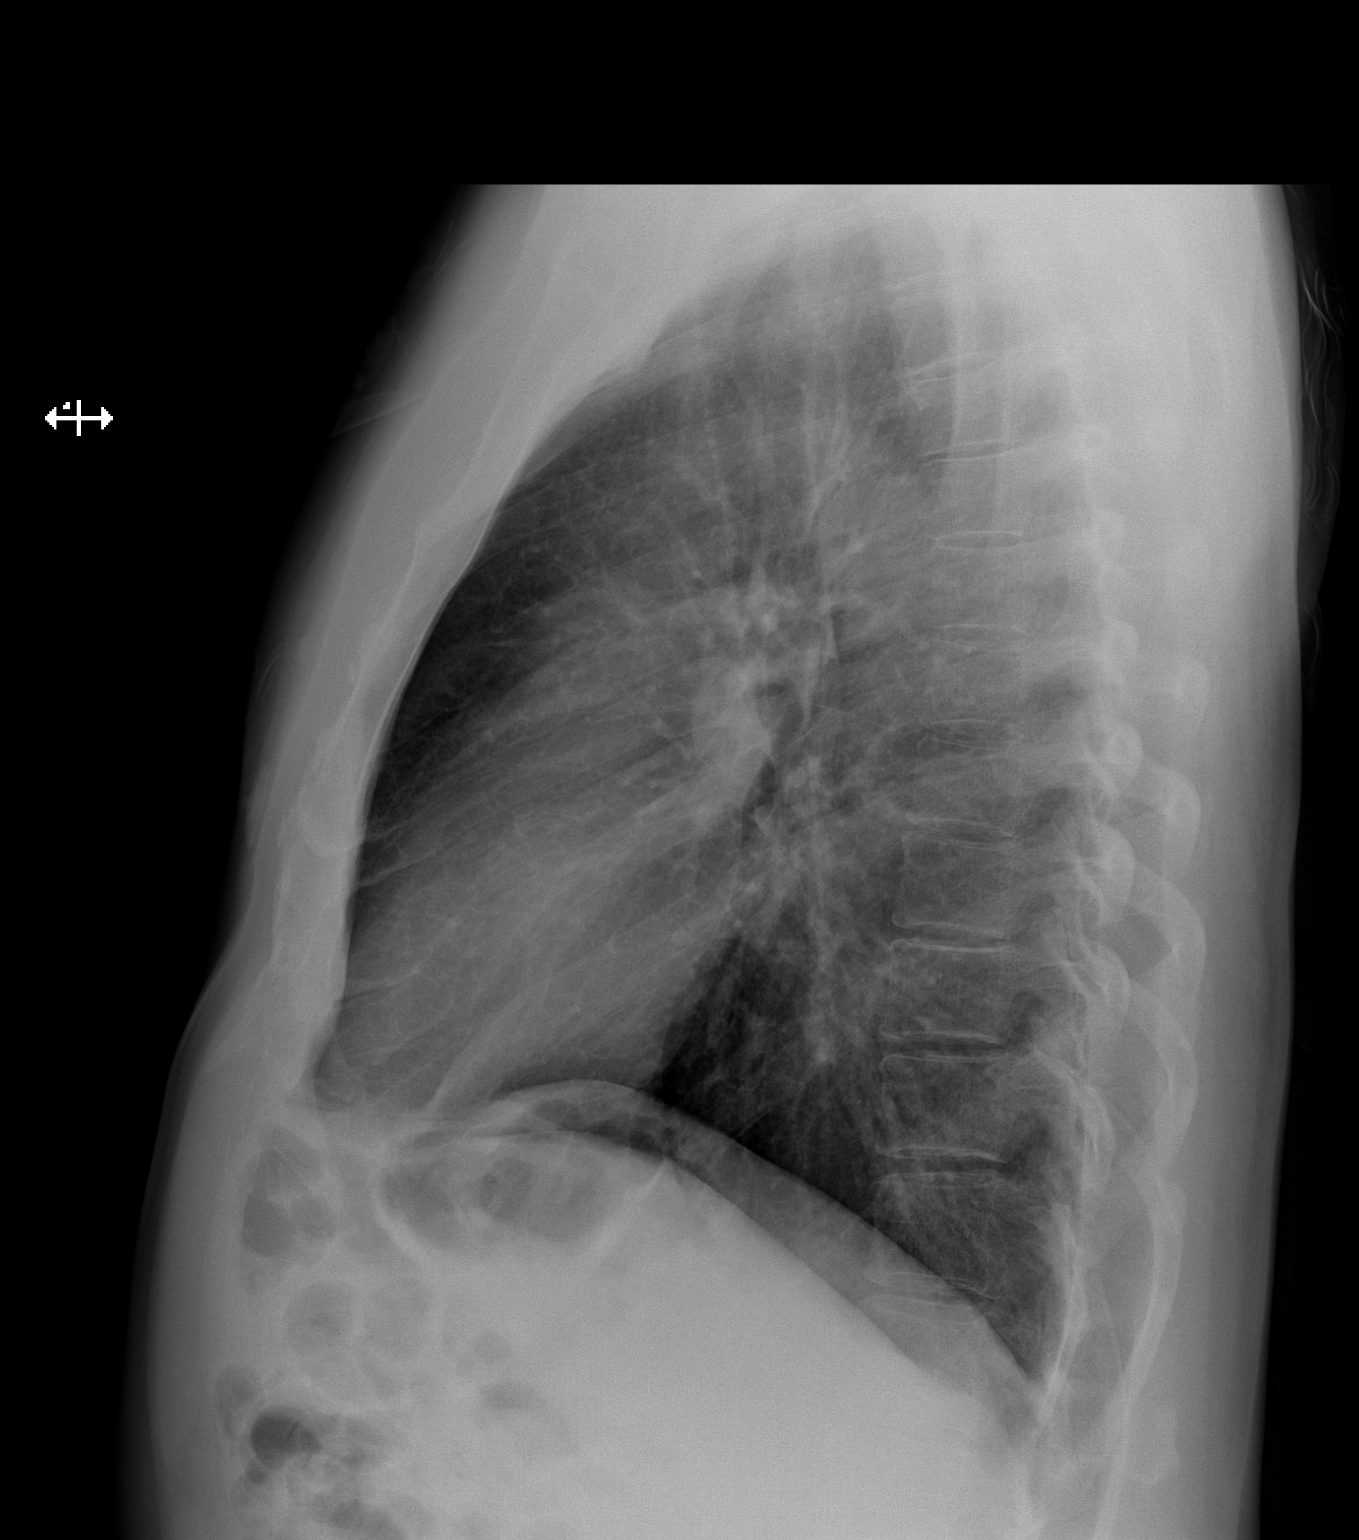

[2 of 2 positions shown; findings below may reference images not displayed]

FINDINGS: The heart size and mediastinal contours are within normal limits.
Both lungs are clear. The visualized skeletal structures are
unremarkable.
IMPRESSION: No active cardiopulmonary disease.

## 2020-10-27 IMAGING — CT CT HEART MORP W/ CTA COR W/ SCORE W/ CA W/CM &/OR W/O CM
4 of 7 series · 8 of 20 positions shown, 9 images · non-contrast
Comparison: None.

Addendum:
CLINICAL DATA: 53-year-old male with h/o hypertension and chest
pain.

EXAM:
Cardiac/Coronary  CTA
TECHNIQUE: The patient was scanned on a [employer] Force scanner.

[Series 6: best diast 73 % · axial · 0.39mm/px · z∈[+39,+88]mm · 2 of 368 slices shown]
[im 123/368  vessel]
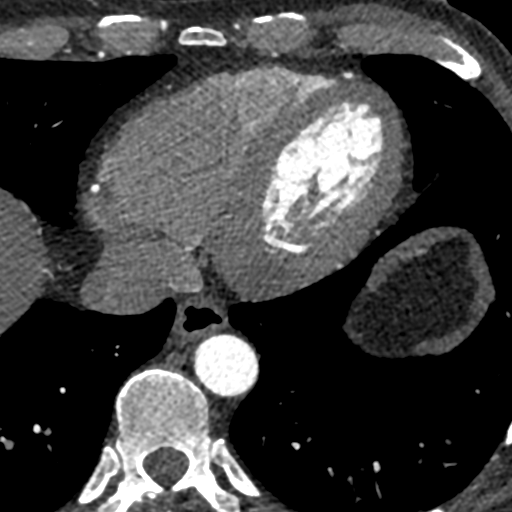
[im 245/368  vessel]
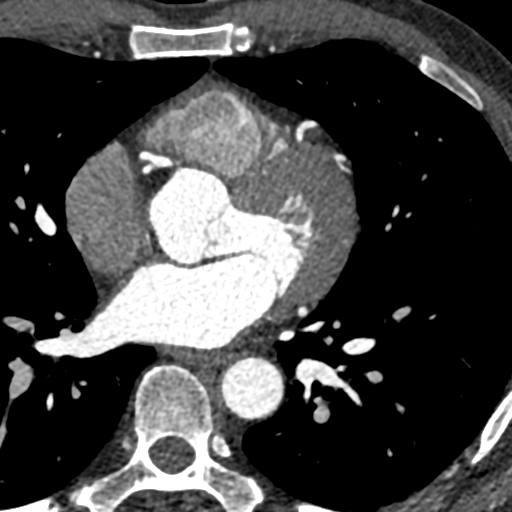

[Series 7: best syst · axial · 0.39mm/px · z∈[+39,+88]mm · 2 of 368 slices shown, 3 images]
[im 123/368  vessel]
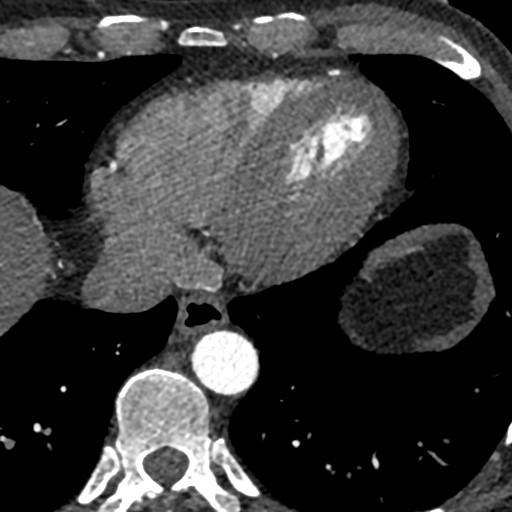
[im 123/368  lung]
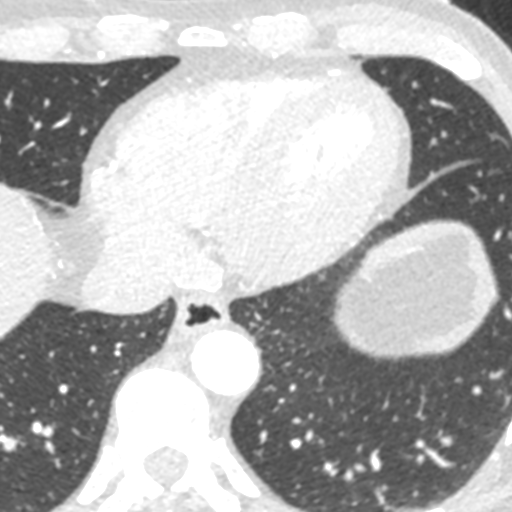
[im 245/368  vessel]
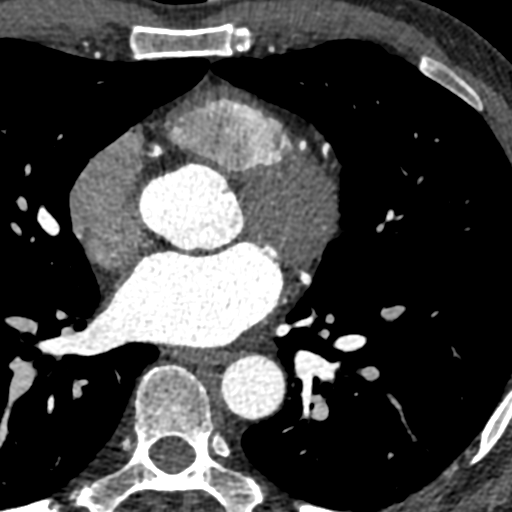

[Series 8: ts diast sharp 73 % · axial · 0.39mm/px · z∈[+39,+88]mm · 2 of 368 slices shown]
[im 123/368  lung]
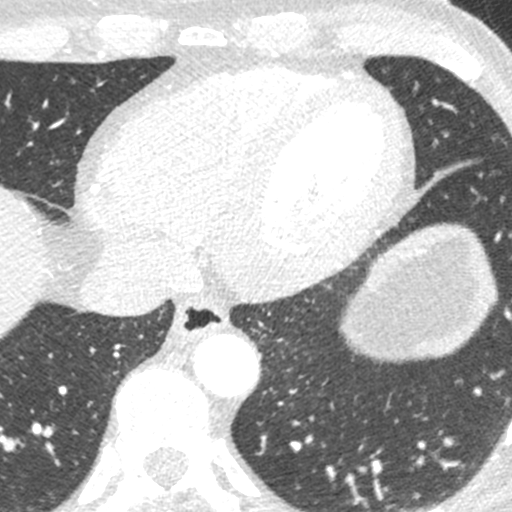
[im 245/368  lung]
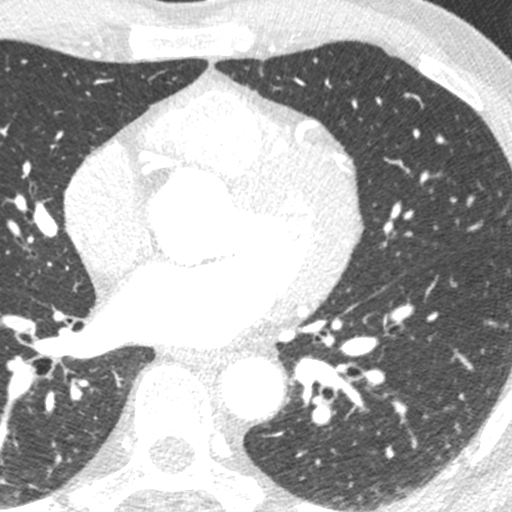

[Series 9: ts syst sharp · axial · 0.39mm/px · z∈[+39,+88]mm · 2 of 368 slices shown]
[im 123/368  lung]
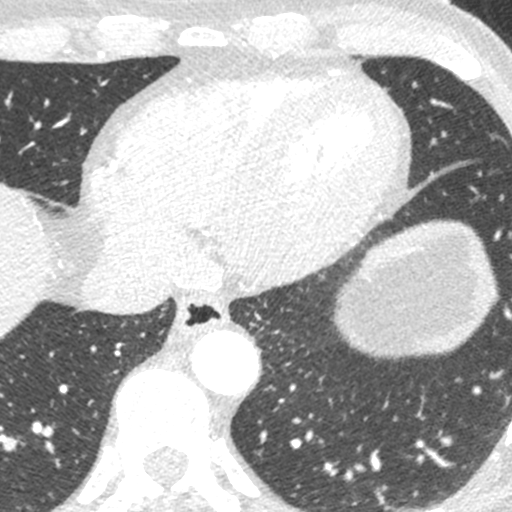
[im 245/368  lung]
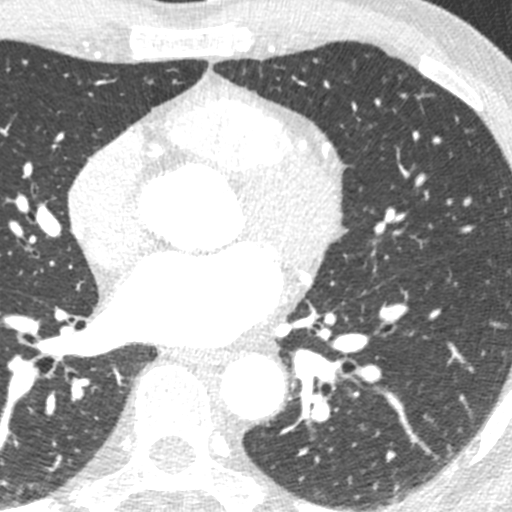

[8 of 20 positions shown; findings below may reference images not displayed]

FINDINGS: A 100 kV prospective scan was triggered in the descending thoracic
aorta at 111 HU's. Axial non-contrast 3 mm slices were carried out
through the heart. The data set was analyzed on a dedicated work
station and scored using the Agatson method. Gantry rotation speed
was 250 msecs and collimation was .6 mm. 100 mg of PO metoprolol and
0.8 mg of sl NTG was given. The 3D data set was reconstructed in 5%
intervals of the 67-82 % of the R-R cycle. Diastolic phases were
analyzed on a dedicated work station using MPR, MIP and VRT modes.
The patient received 80 cc of contrast.

Aorta:  Normal size.  No calcifications.  No dissection.

Aortic Valve:  Trileaflet.  No calcifications.

Coronary Arteries:  Normal coronary origin.  Right dominance.

RCA is a large dominant artery that gives rise to PDA and PLA. There
is no plaque.

Left main is a large artery that gives rise to LAD and LCX arteries.
Left main has no plaque.

LAD is a large vessel that gives rise to a large diagonal artery and
has no plaque. LAD has trivial plaque in the proximal portion.

LCX is a non-dominant artery that gives rise to one large OM1
branch. There is no plaque.

Other findings:

Normal pulmonary vein drainage into the left atrium.

Normal left atrial appendage without a thrombus.

Normal size of the pulmonary artery.
IMPRESSION: 1. Coronary calcium score of 1. This was 49 percentile for age and
sex matched control.

2. Normal coronary origin with right dominance.

3. CAD-RADS 1. Minimal non-obstructive CAD (0-24%). Consider
non-atherosclerotic causes of chest pain. Consider preventive
therapy and risk factor modification.

EXAM:
OVER-READ INTERPRETATION  CT CHEST

The following report is an over-read performed by radiologist Dr.
does not include interpretation of cardiac or coronary anatomy or
pathology. The coronary CTA interpretation by the cardiologist is
attached.
FINDINGS: Cardiovascular: Normal heart size. No significant pericardial
effusion/thickening. Great vessels are normal in course and caliber.
No central pulmonary emboli.

Mediastinum/Nodes: Unremarkable esophagus. No pathologically
enlarged mediastinal or hilar lymph nodes.

Lungs/Pleura: No pneumothorax. No pleural effusion. No acute
consolidative airspace disease, lung masses or significant pulmonary
nodules.

Upper abdomen: No acute abnormality.

Musculoskeletal:  No aggressive appearing focal osseous lesions.
IMPRESSION: No significant extracardiac findings.

*** End of Addendum ***
FINDINGS: A 100 kV prospective scan was triggered in the descending thoracic
aorta at 111 HU's. Axial non-contrast 3 mm slices were carried out
through the heart. The data set was analyzed on a dedicated work
station and scored using the Agatson method. Gantry rotation speed
was 250 msecs and collimation was .6 mm. 100 mg of PO metoprolol and
0.8 mg of sl NTG was given. The 3D data set was reconstructed in 5%
intervals of the 67-82 % of the R-R cycle. Diastolic phases were
analyzed on a dedicated work station using MPR, MIP and VRT modes.
The patient received 80 cc of contrast.

Aorta:  Normal size.  No calcifications.  No dissection.

Aortic Valve:  Trileaflet.  No calcifications.

Coronary Arteries:  Normal coronary origin.  Right dominance.

RCA is a large dominant artery that gives rise to PDA and PLA. There
is no plaque.

Left main is a large artery that gives rise to LAD and LCX arteries.
Left main has no plaque.

LAD is a large vessel that gives rise to a large diagonal artery and
has no plaque. LAD has trivial plaque in the proximal portion.

LCX is a non-dominant artery that gives rise to one large OM1
branch. There is no plaque.

Other findings:

Normal pulmonary vein drainage into the left atrium.

Normal left atrial appendage without a thrombus.

Normal size of the pulmonary artery.
IMPRESSION: 1. Coronary calcium score of 1. This was 49 percentile for age and
sex matched control.

2. Normal coronary origin with right dominance.

3. CAD-RADS 1. Minimal non-obstructive CAD (0-24%). Consider
non-atherosclerotic causes of chest pain. Consider preventive
therapy and risk factor modification.

## 2020-11-16 ENCOUNTER — Other Ambulatory Visit: Payer: Self-pay | Admitting: Family Medicine

## 2020-11-16 DIAGNOSIS — K219 Gastro-esophageal reflux disease without esophagitis: Secondary | ICD-10-CM

## 2020-11-16 NOTE — Telephone Encounter (Signed)
Last office visit- 09/28/20 Last refill- 05/02/2020--30 tabs with 5 refills

## 2021-05-13 ENCOUNTER — Encounter: Payer: Self-pay | Admitting: Family Medicine

## 2021-05-14 ENCOUNTER — Other Ambulatory Visit: Payer: Self-pay

## 2021-05-14 MED ORDER — SUMATRIPTAN SUCCINATE 50 MG PO TABS: ORAL_TABLET | ORAL | 2 refills | Status: DC

## 2021-05-16 ENCOUNTER — Encounter: Payer: Self-pay | Admitting: Family Medicine

## 2021-05-16 DIAGNOSIS — Z20822 Contact with and (suspected) exposure to covid-19: Secondary | ICD-10-CM | POA: Diagnosis not present

## 2021-05-17 NOTE — Telephone Encounter (Signed)
He will need an OV to be treated

## 2021-06-23 ENCOUNTER — Other Ambulatory Visit: Payer: Self-pay | Admitting: Family Medicine

## 2021-07-22 ENCOUNTER — Other Ambulatory Visit: Payer: Self-pay | Admitting: Family Medicine

## 2021-07-22 NOTE — Telephone Encounter (Signed)
Pt needs appointment for further refill 

## 2021-09-12 ENCOUNTER — Other Ambulatory Visit: Payer: Self-pay | Admitting: Family Medicine

## 2021-09-13 ENCOUNTER — Other Ambulatory Visit: Payer: Self-pay | Admitting: Family Medicine

## 2021-09-17 ENCOUNTER — Other Ambulatory Visit: Payer: Self-pay | Admitting: Family Medicine

## 2021-10-02 ENCOUNTER — Encounter: Payer: BC Managed Care – PPO | Admitting: Family Medicine

## 2021-10-10 ENCOUNTER — Other Ambulatory Visit: Payer: Self-pay | Admitting: Family Medicine

## 2021-10-21 ENCOUNTER — Ambulatory Visit (INDEPENDENT_AMBULATORY_CARE_PROVIDER_SITE_OTHER): Payer: BC Managed Care – PPO | Admitting: Family Medicine

## 2021-10-21 ENCOUNTER — Encounter: Payer: Self-pay | Admitting: Family Medicine

## 2021-10-21 VITALS — BP 124/78 | HR 55 | Temp 98.0°F | Ht 70.0 in | Wt 192.4 lb

## 2021-10-21 DIAGNOSIS — Z0000 Encounter for general adult medical examination without abnormal findings: Secondary | ICD-10-CM | POA: Diagnosis not present

## 2021-10-21 DIAGNOSIS — E039 Hypothyroidism, unspecified: Secondary | ICD-10-CM | POA: Diagnosis not present

## 2021-10-21 DIAGNOSIS — Z23 Encounter for immunization: Secondary | ICD-10-CM

## 2021-10-21 DIAGNOSIS — Z125 Encounter for screening for malignant neoplasm of prostate: Secondary | ICD-10-CM | POA: Diagnosis not present

## 2021-10-21 LAB — LIPID PANEL
Cholesterol: 164 mg/dL (ref 0–200)
HDL: 50.6 mg/dL (ref >39.00)
LDL Cholesterol: 92 mg/dL (ref 0–99)
NonHDL: 113.22
Total CHOL/HDL Ratio: 3
Triglycerides: 104 mg/dL (ref 0.0–149.0)
VLDL: 20.8 mg/dL (ref 0.0–40.0)

## 2021-10-21 LAB — CBC WITH DIFFERENTIAL/PLATELET
Basophils Absolute: 0 10*3/uL (ref 0.0–0.1)
Basophils Relative: 0.3 % (ref 0.0–3.0)
Eosinophils Absolute: 0.2 10*3/uL (ref 0.0–0.7)
Eosinophils Relative: 2.9 % (ref 0.0–5.0)
HCT: 44.9 % (ref 39.0–52.0)
Hemoglobin: 15.3 g/dL (ref 13.0–17.0)
Lymphocytes Relative: 32.3 % (ref 12.0–46.0)
Lymphs Abs: 2.4 10*3/uL (ref 0.7–4.0)
MCHC: 34.1 g/dL (ref 30.0–36.0)
MCV: 89 fl (ref 78.0–100.0)
Monocytes Absolute: 0.8 10*3/uL (ref 0.1–1.0)
Monocytes Relative: 10.2 % (ref 3.0–12.0)
Neutro Abs: 4.1 10*3/uL (ref 1.4–7.7)
Neutrophils Relative %: 54.3 % (ref 43.0–77.0)
Platelets: 209 10*3/uL (ref 150.0–400.0)
RBC: 5.05 Mil/uL (ref 4.22–5.81)
RDW: 13.1 % (ref 11.5–15.5)
WBC: 7.5 10*3/uL (ref 4.0–10.5)

## 2021-10-21 LAB — HEPATIC FUNCTION PANEL
ALT: 26 U/L (ref 0–53)
AST: 18 U/L (ref 0–37)
Albumin: 4.6 g/dL (ref 3.5–5.2)
Alkaline Phosphatase: 59 U/L (ref 39–117)
Bilirubin, Direct: 0.3 mg/dL (ref 0.0–0.3)
Total Bilirubin: 1.6 mg/dL — ABNORMAL HIGH (ref 0.2–1.2)
Total Protein: 6.7 g/dL (ref 6.0–8.3)

## 2021-10-21 LAB — HEMOGLOBIN A1C: Hgb A1c MFr Bld: 5.7 % (ref 4.6–6.5)

## 2021-10-21 LAB — BASIC METABOLIC PANEL
BUN: 18 mg/dL (ref 6–23)
CO2: 29 mEq/L (ref 19–32)
Calcium: 9.8 mg/dL (ref 8.4–10.5)
Chloride: 104 mEq/L (ref 96–112)
Creatinine, Ser: 1.03 mg/dL (ref 0.40–1.50)
GFR: 83.08 mL/min (ref >60.00)
Glucose, Bld: 87 mg/dL (ref 70–99)
Potassium: 4.8 mEq/L (ref 3.5–5.1)
Sodium: 141 mEq/L (ref 135–145)

## 2021-10-21 LAB — TSH: TSH: 0.18 u[IU]/mL — ABNORMAL LOW (ref 0.35–5.50)

## 2021-10-21 LAB — T3, FREE: T3, Free: 3.3 pg/mL (ref 2.3–4.2)

## 2021-10-21 LAB — PSA: PSA: 0.55 ng/mL (ref 0.10–4.00)

## 2021-10-21 LAB — T4, FREE: Free T4: 1.36 ng/dL (ref 0.60–1.60)

## 2021-10-21 NOTE — Addendum Note (Signed)
Addended by: Wyvonne Lenz on: 10/21/2021 01:38 PM   Modules accepted: Orders

## 2021-10-21 NOTE — Progress Notes (Signed)
   Subjective:    Patient ID: Derrick Medina, male    DOB: 1966-01-07, 55 y.o.   MRN: [id number]  HPI Here for a well exam. He feels fine.    Review of Systems  Constitutional: Negative.   HENT: Negative.    Eyes: Negative.   Respiratory: Negative.    Cardiovascular: Negative.   Gastrointestinal: Negative.   Genitourinary: Negative.   Musculoskeletal: Negative.   Skin: Negative.   Neurological: Negative.   Psychiatric/Behavioral: Negative.        Objective:   Physical Exam Constitutional:      General: He is not in acute distress.    Appearance: Normal appearance. He is well-developed. He is not diaphoretic.  HENT:     Head: Normocephalic and atraumatic.     Right Ear: External ear normal.     Left Ear: External ear normal.     Nose: Nose normal.     Mouth/Throat:     Pharynx: No oropharyngeal exudate.  Eyes:     General: No scleral icterus.       Right eye: No discharge.        Left eye: No discharge.     Conjunctiva/sclera: Conjunctivae normal.     Pupils: Pupils are equal, round, and reactive to light.  Neck:     Thyroid: No thyromegaly.     Vascular: No JVD.     Trachea: No tracheal deviation.  Cardiovascular:     Rate and Rhythm: Normal rate and regular rhythm.     Heart sounds: Normal heart sounds. No murmur heard.   No friction rub. No gallop.  Pulmonary:     Effort: Pulmonary effort is normal. No respiratory distress.     Breath sounds: Normal breath sounds. No wheezing or rales.  Chest:     Chest wall: No tenderness.  Abdominal:     General: Bowel sounds are normal. There is no distension.     Palpations: Abdomen is soft. There is no mass.     Tenderness: There is no abdominal tenderness. There is no guarding or rebound.  Genitourinary:    Penis: Normal. No tenderness.      Testes: Normal.     Prostate: Normal.     Rectum: Normal. Guaiac result negative.  Musculoskeletal:        General: No tenderness. Normal range of motion.     Cervical back:  Neck supple.  Lymphadenopathy:     Cervical: No cervical adenopathy.  Skin:    General: Skin is warm and dry.     Coloration: Skin is not pale.     Findings: No erythema or rash.  Neurological:     Mental Status: He is alert and oriented to person, place, and time.     Cranial Nerves: No cranial nerve deficit.     Motor: No abnormal muscle tone.     Coordination: Coordination normal.     Deep Tendon Reflexes: Reflexes are normal and symmetric. Reflexes normal.  Psychiatric:        Behavior: Behavior normal.        Thought Content: Thought content normal.        Judgment: Judgment normal.          Assessment & Plan:  Well exam. We discussed diet and exercise. Get fasting labs. Alysia Penna, MD

## 2021-11-09 ENCOUNTER — Other Ambulatory Visit: Payer: Self-pay | Admitting: Family Medicine

## 2021-12-09 ENCOUNTER — Other Ambulatory Visit: Payer: Self-pay | Admitting: Family Medicine

## 2021-12-18 ENCOUNTER — Other Ambulatory Visit: Payer: Self-pay | Admitting: Family Medicine

## 2021-12-18 DIAGNOSIS — I10 Essential (primary) hypertension: Secondary | ICD-10-CM

## 2022-03-11 ENCOUNTER — Other Ambulatory Visit: Payer: Self-pay | Admitting: Family Medicine

## 2022-03-13 ENCOUNTER — Other Ambulatory Visit: Payer: Self-pay | Admitting: Family Medicine

## 2022-03-13 DIAGNOSIS — G43801 Other migraine, not intractable, with status migrainosus: Secondary | ICD-10-CM

## 2022-06-11 ENCOUNTER — Other Ambulatory Visit: Payer: Self-pay | Admitting: Family Medicine

## 2022-06-14 ENCOUNTER — Other Ambulatory Visit: Payer: Self-pay | Admitting: Family Medicine

## 2022-06-14 DIAGNOSIS — G43801 Other migraine, not intractable, with status migrainosus: Secondary | ICD-10-CM

## 2022-06-17 ENCOUNTER — Other Ambulatory Visit: Payer: Self-pay | Admitting: Family Medicine

## 2022-06-17 DIAGNOSIS — I10 Essential (primary) hypertension: Secondary | ICD-10-CM

## 2022-07-22 ENCOUNTER — Encounter: Payer: Self-pay | Admitting: Family Medicine

## 2022-07-24 ENCOUNTER — Telehealth (INDEPENDENT_AMBULATORY_CARE_PROVIDER_SITE_OTHER): Payer: BC Managed Care – PPO | Admitting: Family Medicine

## 2022-07-24 ENCOUNTER — Encounter: Payer: Self-pay | Admitting: Family Medicine

## 2022-07-24 DIAGNOSIS — U071 COVID-19: Secondary | ICD-10-CM

## 2022-07-24 MED ORDER — NIRMATRELVIR/RITONAVIR (PAXLOVID)TABLET: 3.0000 | ORAL_TABLET | Freq: Two times a day (BID) | ORAL | 0 refills | Status: AC

## 2022-07-24 NOTE — Progress Notes (Signed)
Subjective:    Patient ID: Derrick Medina, male    DOB: Mar 31, 1966, 56 y.o.   MRN: [id number]  HPI Virtual Visit via Video Note  I connected with the patient on 07/24/22 at 10:00 AM EDT by a video enabled telemedicine application and verified that I am speaking with the correct person using two identifiers.  Location patient: home Location provider:work or home office Persons participating in the virtual visit: patient, provider  I discussed the limitations of evaluation and management by telemedicine and the availability of in person appointments. The patient expressed understanding and agreed to proceed.   HPI: Here for a Covid-19 infection. About 5 days ago he began to have a ST, dry cough, low grade fever, body aches, and diarrhea. His wife has had similar symptoms. They both tested positive for the Covid virus yesterday. He is drinking fluids and taking Nyquil and Dayquil.    ROS: See pertinent positives and negatives per HPI.  Past Medical History:  Diagnosis Date   Heart murmur    Hyperlipidemia    Hypertension    Hypothyroidism (acquired) 11/26/2011    had radioactive ablation on 01-30-12   Migraines     Past Surgical History:  Procedure Laterality Date   COLONOSCOPY  04/05/2019   per Dr. Fuller Plan, benign polyp, repeat in 10 yrs    LIPOMA EXCISION N/A 10/06/2017   Procedure: EXCISION LIPOMA;  Surgeon: Youlanda Roys, MD;  Location: [facility];  Service: Plastics;  Laterality: N/A;    Family History  Problem Relation Age of Onset   Hyperlipidemia Other        family hx   Hyperlipidemia Mother    Hypertension Mother    Hyperthyroidism Mother    Hyperlipidemia Father    Colon polyps Father    Colon cancer Neg Hx    Esophageal cancer Neg Hx    Rectal cancer Neg Hx    Stomach cancer Neg Hx    CAD Neg Hx      Current Outpatient Medications:    atorvastatin (LIPITOR) 20 MG tablet, TAKE 1 TABLET BY MOUTH EVERY DAY, Disp: 90 tablet, Rfl:  0   levothyroxine (SYNTHROID) 150 MCG tablet, TAKE 1 TABLET BY MOUTH EVERY DAY BEFORE BREAKFAST, Disp: 90 tablet, Rfl: 0   metoprolol succinate (TOPROL-XL) 50 MG 24 hr tablet, TAKE 1 TABLET BY MOUTH EVERY DAY, Disp: 90 tablet, Rfl: 0   propranolol ER (INDERAL LA) 60 MG 24 hr capsule, Take 1 capsule (60 mg total) by mouth daily as needed (migraines)., Disp: 90 capsule, Rfl: 3   SUMAtriptan (IMITREX) 50 MG tablet, TAKE 1 TABLET BY MOUTH AS NEEDED FOR MIGRAINE. MAY REPEAT IN 2 HOURS IF HEADACHE PERSISTS OR RECURES, Disp: 30 tablet, Rfl: 2   nirmatrelvir/ritonavir EUA (PAXLOVID) 20 x 150 MG & 10 x '100MG'$  TABS, Take 3 tablets by mouth 2 (two) times daily for 5 days. (Take nirmatrelvir 150 mg two tablets twice daily for 5 days and ritonavir 100 mg one tablet twice daily for 5 days) Patient GFR is 83, Disp: 30 tablet, Rfl: 0  EXAM:  VITALS per patient if applicable:  GENERAL: alert, oriented, appears well and in no acute distress  HEENT: atraumatic, conjunttiva clear, no obvious abnormalities on inspection of external nose and ears  NECK: normal movements of the head and neck  LUNGS: on inspection no signs of respiratory distress, breathing rate appears normal, no obvious gross SOB, gasping or wheezing  CV: no obvious cyanosis  MS: moves  all visible extremities without noticeable abnormality  PSYCH/NEURO: pleasant and cooperative, no obvious depression or anxiety, speech and thought processing grossly intact  ASSESSMENT AND PLAN: Covid infection. Treat with 5 days of Paxlovid. Recheck as needed.  Alysia Penna, MD  Discussed the following assessment and plan:  No diagnosis found.     I discussed the assessment and treatment plan with the patient. The patient was provided an opportunity to ask questions and all were answered. The patient agreed with the plan and demonstrated an understanding of the instructions.   The patient was advised to call back or seek an in-person evaluation if the  symptoms worsen or if the condition fails to improve as anticipated.      Review of Systems     Objective:   Physical Exam        Assessment & Plan:

## 2022-09-02 ENCOUNTER — Other Ambulatory Visit: Payer: Self-pay | Admitting: Family Medicine

## 2022-09-02 DIAGNOSIS — G43801 Other migraine, not intractable, with status migrainosus: Secondary | ICD-10-CM

## 2022-09-06 ENCOUNTER — Other Ambulatory Visit: Payer: Self-pay | Admitting: Family Medicine

## 2022-09-08 NOTE — Telephone Encounter (Signed)
Pt needs appointment for further refills 

## 2022-09-11 ENCOUNTER — Other Ambulatory Visit: Payer: Self-pay | Admitting: Family Medicine

## 2022-09-11 DIAGNOSIS — I10 Essential (primary) hypertension: Secondary | ICD-10-CM

## 2022-10-24 ENCOUNTER — Ambulatory Visit (INDEPENDENT_AMBULATORY_CARE_PROVIDER_SITE_OTHER): Payer: BC Managed Care – PPO | Admitting: Family Medicine

## 2022-10-24 ENCOUNTER — Encounter: Payer: Self-pay | Admitting: Family Medicine

## 2022-10-24 VITALS — BP 130/84 | HR 71 | Temp 97.7°F | Ht 70.0 in | Wt 191.0 lb

## 2022-10-24 DIAGNOSIS — I10 Essential (primary) hypertension: Secondary | ICD-10-CM

## 2022-10-24 DIAGNOSIS — Z0000 Encounter for general adult medical examination without abnormal findings: Secondary | ICD-10-CM | POA: Diagnosis not present

## 2022-10-24 DIAGNOSIS — Z23 Encounter for immunization: Secondary | ICD-10-CM

## 2022-10-24 DIAGNOSIS — Z125 Encounter for screening for malignant neoplasm of prostate: Secondary | ICD-10-CM | POA: Diagnosis not present

## 2022-10-24 DIAGNOSIS — E039 Hypothyroidism, unspecified: Secondary | ICD-10-CM | POA: Diagnosis not present

## 2022-10-24 LAB — LIPID PANEL
Cholesterol: 168 mg/dL (ref 0–200)
HDL: 47.6 mg/dL (ref >39.00)
LDL Cholesterol: 102 mg/dL — ABNORMAL HIGH (ref 0–99)
NonHDL: 120.75
Total CHOL/HDL Ratio: 4
Triglycerides: 94 mg/dL (ref 0.0–149.0)
VLDL: 18.8 mg/dL (ref 0.0–40.0)

## 2022-10-24 LAB — CBC WITH DIFFERENTIAL/PLATELET
Basophils Absolute: 0 10*3/uL (ref 0.0–0.1)
Basophils Relative: 0.4 % (ref 0.0–3.0)
Eosinophils Absolute: 0.2 10*3/uL (ref 0.0–0.7)
Eosinophils Relative: 2.5 % (ref 0.0–5.0)
HCT: 46.2 % (ref 39.0–52.0)
Hemoglobin: 15.9 g/dL (ref 13.0–17.0)
Lymphocytes Relative: 32.5 % (ref 12.0–46.0)
Lymphs Abs: 2.3 10*3/uL (ref 0.7–4.0)
MCHC: 34.4 g/dL (ref 30.0–36.0)
MCV: 88.3 fl (ref 78.0–100.0)
Monocytes Absolute: 0.7 10*3/uL (ref 0.1–1.0)
Monocytes Relative: 9.3 % (ref 3.0–12.0)
Neutro Abs: 3.9 10*3/uL (ref 1.4–7.7)
Neutrophils Relative %: 55.3 % (ref 43.0–77.0)
Platelets: 210 10*3/uL (ref 150.0–400.0)
RBC: 5.23 Mil/uL (ref 4.22–5.81)
RDW: 13.6 % (ref 11.5–15.5)
WBC: 7.1 10*3/uL (ref 4.0–10.5)

## 2022-10-24 LAB — BASIC METABOLIC PANEL
BUN: 18 mg/dL (ref 6–23)
CO2: 28 mEq/L (ref 19–32)
Calcium: 9.6 mg/dL (ref 8.4–10.5)
Chloride: 105 mEq/L (ref 96–112)
Creatinine, Ser: 0.99 mg/dL (ref 0.40–1.50)
GFR: 86.51 mL/min (ref >60.00)
Glucose, Bld: 81 mg/dL (ref 70–99)
Potassium: 4.5 mEq/L (ref 3.5–5.1)
Sodium: 141 mEq/L (ref 135–145)

## 2022-10-24 LAB — HEMOGLOBIN A1C: Hgb A1c MFr Bld: 5.5 % (ref 4.6–6.5)

## 2022-10-24 LAB — T3, FREE: T3, Free: 3.7 pg/mL (ref 2.3–4.2)

## 2022-10-24 LAB — HEPATIC FUNCTION PANEL
ALT: 25 U/L (ref 0–53)
AST: 19 U/L (ref 0–37)
Albumin: 4.7 g/dL (ref 3.5–5.2)
Alkaline Phosphatase: 59 U/L (ref 39–117)
Bilirubin, Direct: 0.2 mg/dL (ref 0.0–0.3)
Total Bilirubin: 1.5 mg/dL — ABNORMAL HIGH (ref 0.2–1.2)
Total Protein: 6.8 g/dL (ref 6.0–8.3)

## 2022-10-24 LAB — T4, FREE: Free T4: 1.66 ng/dL — ABNORMAL HIGH (ref 0.60–1.60)

## 2022-10-24 LAB — TSH: TSH: 0.04 u[IU]/mL — ABNORMAL LOW (ref 0.35–5.50)

## 2022-10-24 LAB — PSA: PSA: 0.5 ng/mL (ref 0.10–4.00)

## 2022-10-24 MED ORDER — METOPROLOL SUCCINATE ER 50 MG PO TB24: 50.0000 mg | ORAL_TABLET | Freq: Every day | ORAL | 3 refills | Status: DC

## 2022-10-24 MED ORDER — ATORVASTATIN CALCIUM 20 MG PO TABS: 20.0000 mg | ORAL_TABLET | Freq: Every day | ORAL | 3 refills | Status: DC

## 2022-10-24 MED ORDER — LEVOTHYROXINE SODIUM 150 MCG PO TABS: ORAL_TABLET | ORAL | 3 refills | Status: DC

## 2022-10-24 NOTE — Addendum Note (Signed)
Addended by: Wyvonne Lenz on: 10/24/2022 01:20 PM   Modules accepted: Orders

## 2022-10-24 NOTE — Progress Notes (Signed)
   Subjective:    Patient ID: Derrick Medina, male    DOB: 01-25-1966, 56 y.o.   MRN: [id number]  HPI Here for a well exam. He feels well. His BP at home is stable.    Review of Systems  Constitutional: Negative.   HENT: Negative.    Eyes: Negative.   Respiratory: Negative.    Cardiovascular: Negative.   Gastrointestinal: Negative.   Genitourinary: Negative.   Musculoskeletal: Negative.   Skin: Negative.   Neurological: Negative.   Psychiatric/Behavioral: Negative.         Objective:   Physical Exam Constitutional:      General: He is not in acute distress.    Appearance: Normal appearance. He is well-developed. He is not diaphoretic.  HENT:     Head: Normocephalic and atraumatic.     Right Ear: External ear normal.     Left Ear: External ear normal.     Nose: Nose normal.     Mouth/Throat:     Pharynx: No oropharyngeal exudate.  Eyes:     General: No scleral icterus.       Right eye: No discharge.        Left eye: No discharge.     Conjunctiva/sclera: Conjunctivae normal.     Pupils: Pupils are equal, round, and reactive to light.  Neck:     Thyroid: No thyromegaly.     Vascular: No JVD.     Trachea: No tracheal deviation.  Cardiovascular:     Rate and Rhythm: Normal rate and regular rhythm.     Heart sounds: Normal heart sounds. No murmur heard.    No friction rub. No gallop.  Pulmonary:     Effort: Pulmonary effort is normal. No respiratory distress.     Breath sounds: Normal breath sounds. No wheezing or rales.  Chest:     Chest wall: No tenderness.  Abdominal:     General: Bowel sounds are normal. There is no distension.     Palpations: Abdomen is soft. There is no mass.     Tenderness: There is no abdominal tenderness. There is no guarding or rebound.  Genitourinary:    Penis: Normal. No tenderness.      Testes: Normal.     Prostate: Normal.     Rectum: Normal. Guaiac result negative.  Musculoskeletal:        General: No tenderness. Normal range of  motion.     Cervical back: Neck supple.  Lymphadenopathy:     Cervical: No cervical adenopathy.  Skin:    General: Skin is warm and dry.     Coloration: Skin is not pale.     Findings: No erythema or rash.  Neurological:     Mental Status: He is alert and oriented to person, place, and time.     Cranial Nerves: No cranial nerve deficit.     Motor: No abnormal muscle tone.     Coordination: Coordination normal.     Deep Tendon Reflexes: Reflexes are normal and symmetric. Reflexes normal.  Psychiatric:        Behavior: Behavior normal.        Thought Content: Thought content normal.        Judgment: Judgment normal.           Assessment & Plan:  Well exam. We discussed diet and exercise. Get fasting labs. Alysia Penna, MD

## 2022-10-28 ENCOUNTER — Other Ambulatory Visit: Payer: Self-pay

## 2022-10-28 DIAGNOSIS — E039 Hypothyroidism, unspecified: Secondary | ICD-10-CM

## 2022-10-28 MED ORDER — LEVOTHYROXINE SODIUM 125 MCG PO TABS: 125.0000 ug | ORAL_TABLET | Freq: Every day | ORAL | 3 refills | Status: DC

## 2022-11-30 ENCOUNTER — Other Ambulatory Visit: Payer: Self-pay | Admitting: Family Medicine

## 2022-11-30 DIAGNOSIS — G43801 Other migraine, not intractable, with status migrainosus: Secondary | ICD-10-CM

## 2022-12-22 ENCOUNTER — Telehealth: Payer: Self-pay

## 2022-12-22 NOTE — Patient Instructions (Signed)
Visit Information  Thank you for taking time to visit with me today. Please don't hesitate to contact me if I can be of assistance to you.   Following are the goals we discussed today:   Goals Addressed             This Visit's Progress    COMPLETED: Care coordination activities-No follow up required       Interventions Today    Flowsheet Row Most Recent Value  General Interventions   General Interventions Discussed/Reviewed General Interventions Discussed, Health Screening, Doctor Visits  Doctor Visits Discussed/Reviewed Annual Wellness Visits, Doctor Visits Discussed  Health Screening Colonoscopy              If you are experiencing a Mental Health or [place] or need someone to talk to, please call the Suicide and Crisis Lifeline: 988   Patient verbalizes understanding of instructions and care plan provided today and agrees to view in [facility]. Active MyChart status and patient understanding of how to access instructions and care plan via MyChart confirmed with patient.     No further follow up required: decline  Jone Baseman, RN, MSN [facility] Care Management Coordinator Direct Line [phone]

## 2022-12-22 NOTE — Patient Outreach (Signed)
  Care Coordination   Initial Visit Note   12/22/2022 Name: Derrick Medina MRN: [id number] DOB: August 01, 1966  Derrick Medina is a 57 y.o. year old male who sees Laurey Morale, MD for primary care. I spoke with  Elane Fritz by phone today.  What matters to the patients health and wellness today?  none    Goals Addressed             This Visit's Progress    COMPLETED: Care coordination activities-No follow up required       Interventions Today    Flowsheet Row Most Recent Value  General Interventions   General Interventions Discussed/Reviewed General Interventions Discussed, Health Screening, Doctor Visits  Doctor Visits Discussed/Reviewed Annual Wellness Visits, Doctor Visits Discussed  Health Screening Colonoscopy              SDOH assessments and interventions completed:  Yes  SDOH Interventions Today    Flowsheet Row Most Recent Value  SDOH Interventions   Food Insecurity Interventions Intervention Not Indicated  Housing Interventions Intervention Not Indicated        Care Coordination Interventions:  Yes, provided   Follow up plan: No further intervention required.   Encounter Outcome:  Pt. Visit Completed   Jone Baseman, RN, MSN [facility] Care Management Coordinator Direct Line [phone]

## 2023-01-30 ENCOUNTER — Other Ambulatory Visit: Payer: BC Managed Care – PPO

## 2023-01-30 DIAGNOSIS — E039 Hypothyroidism, unspecified: Secondary | ICD-10-CM

## 2023-01-30 LAB — T3, FREE: T3, Free: 3.4 pg/mL (ref 2.3–4.2)

## 2023-01-30 LAB — TSH: TSH: 0.99 u[IU]/mL (ref 0.35–5.50)

## 2023-01-30 LAB — T4, FREE: Free T4: 1.55 ng/dL (ref 0.60–1.60)

## 2023-01-30 NOTE — Addendum Note (Signed)
Addended by: Rosalyn Gess D on: 01/30/2023 11:10 AM   Modules accepted: Orders

## 2023-02-28 ENCOUNTER — Other Ambulatory Visit: Payer: Self-pay | Admitting: Family Medicine

## 2023-02-28 DIAGNOSIS — G43801 Other migraine, not intractable, with status migrainosus: Secondary | ICD-10-CM

## 2023-06-13 ENCOUNTER — Other Ambulatory Visit: Payer: Self-pay | Admitting: Family Medicine

## 2023-06-13 DIAGNOSIS — G43801 Other migraine, not intractable, with status migrainosus: Secondary | ICD-10-CM

## 2023-07-24 ENCOUNTER — Other Ambulatory Visit: Payer: Self-pay | Admitting: Family Medicine

## 2023-07-24 DIAGNOSIS — E039 Hypothyroidism, unspecified: Secondary | ICD-10-CM

## 2023-09-08 ENCOUNTER — Other Ambulatory Visit: Payer: Self-pay | Admitting: Family Medicine

## 2023-09-08 DIAGNOSIS — G43801 Other migraine, not intractable, with status migrainosus: Secondary | ICD-10-CM

## 2023-09-22 ENCOUNTER — Other Ambulatory Visit: Payer: Self-pay | Admitting: Family Medicine

## 2023-10-26 ENCOUNTER — Ambulatory Visit (INDEPENDENT_AMBULATORY_CARE_PROVIDER_SITE_OTHER): Payer: BC Managed Care – PPO | Admitting: Family Medicine

## 2023-10-26 ENCOUNTER — Encounter: Payer: Self-pay | Admitting: Family Medicine

## 2023-10-26 VITALS — BP 124/80 | HR 52 | Temp 97.7°F | Ht 70.0 in | Wt 196.0 lb

## 2023-10-26 DIAGNOSIS — Z13220 Encounter for screening for lipoid disorders: Secondary | ICD-10-CM

## 2023-10-26 DIAGNOSIS — I10 Essential (primary) hypertension: Secondary | ICD-10-CM

## 2023-10-26 DIAGNOSIS — Z23 Encounter for immunization: Secondary | ICD-10-CM

## 2023-10-26 DIAGNOSIS — E039 Hypothyroidism, unspecified: Secondary | ICD-10-CM | POA: Diagnosis not present

## 2023-10-26 DIAGNOSIS — Z131 Encounter for screening for diabetes mellitus: Secondary | ICD-10-CM

## 2023-10-26 DIAGNOSIS — G43801 Other migraine, not intractable, with status migrainosus: Secondary | ICD-10-CM

## 2023-10-26 DIAGNOSIS — Z0000 Encounter for general adult medical examination without abnormal findings: Secondary | ICD-10-CM

## 2023-10-26 DIAGNOSIS — Z125 Encounter for screening for malignant neoplasm of prostate: Secondary | ICD-10-CM

## 2023-10-26 MED ORDER — ATORVASTATIN CALCIUM 20 MG PO TABS: 20.0000 mg | ORAL_TABLET | Freq: Every day | ORAL | 3 refills | Status: DC

## 2023-10-26 MED ORDER — METOPROLOL SUCCINATE ER 50 MG PO TB24
50.0000 mg | ORAL_TABLET | Freq: Every day | ORAL | 3 refills | Status: AC
Start: 2023-10-26 — End: ?

## 2023-10-26 MED ORDER — LEVOTHYROXINE SODIUM 125 MCG PO TABS: 125.0000 ug | ORAL_TABLET | Freq: Every day | ORAL | 3 refills | Status: DC

## 2023-10-26 MED ORDER — SUMATRIPTAN SUCCINATE 50 MG PO TABS: ORAL_TABLET | ORAL | 11 refills | Status: DC

## 2023-10-26 NOTE — Progress Notes (Signed)
Subjective:    Patient ID: Derrick Medina, male    DOB: Apr 19, 1966, 57 y.o.   MRN: [id number]  HPI Here for a well exam. He feels well. He has not had a bad migraine for about 2 years.    Review of Systems  Constitutional: Negative.   HENT: Negative.    Eyes: Negative.   Respiratory: Negative.    Cardiovascular: Negative.   Gastrointestinal: Negative.   Genitourinary: Negative.   Musculoskeletal: Negative.   Skin: Negative.   Neurological: Negative.   Psychiatric/Behavioral: Negative.         Objective:   Physical Exam Constitutional:      General: He is not in acute distress.    Appearance: Normal appearance. He is well-developed. He is not diaphoretic.  HENT:     Head: Normocephalic and atraumatic.     Right Ear: External ear normal.     Left Ear: External ear normal.     Nose: Nose normal.     Mouth/Throat:     Pharynx: No oropharyngeal exudate.  Eyes:     General: No scleral icterus.       Right eye: No discharge.        Left eye: No discharge.     Conjunctiva/sclera: Conjunctivae normal.     Pupils: Pupils are equal, round, and reactive to light.  Neck:     Thyroid: No thyromegaly.     Vascular: No JVD.     Trachea: No tracheal deviation.  Cardiovascular:     Rate and Rhythm: Normal rate and regular rhythm.     Pulses: Normal pulses.     Heart sounds: Normal heart sounds. No murmur heard.    No friction rub. No gallop.  Pulmonary:     Effort: Pulmonary effort is normal. No respiratory distress.     Breath sounds: Normal breath sounds. No wheezing or rales.  Chest:     Chest wall: No tenderness.  Abdominal:     General: Bowel sounds are normal. There is no distension.     Palpations: Abdomen is soft. There is no mass.     Tenderness: There is no abdominal tenderness. There is no guarding or rebound.  Genitourinary:    Penis: Normal. No tenderness.      Testes: Normal.     Prostate: Normal.     Rectum: Normal. Guaiac result negative.   Musculoskeletal:        General: No tenderness. Normal range of motion.     Cervical back: Neck supple.  Lymphadenopathy:     Cervical: No cervical adenopathy.  Skin:    General: Skin is warm and dry.     Coloration: Skin is not pale.     Findings: No erythema or rash.  Neurological:     General: No focal deficit present.     Mental Status: He is alert and oriented to person, place, and time.     Cranial Nerves: No cranial nerve deficit.     Motor: No abnormal muscle tone.     Coordination: Coordination normal.     Deep Tendon Reflexes: Reflexes are normal and symmetric. Reflexes normal.  Psychiatric:        Mood and Affect: Mood normal.        Behavior: Behavior normal.        Thought Content: Thought content normal.        Judgment: Judgment normal.           Assessment & Plan:  Well  exam. We discussed diet and exercise. Get fasting labs. Gershon Crane, MD

## 2023-10-27 LAB — LIPID PANEL
Cholesterol: 174 mg/dL (ref 0–200)
HDL: 50.4 mg/dL (ref >39.00)
LDL Cholesterol: 95 mg/dL (ref 0–99)
NonHDL: 123.32
Total CHOL/HDL Ratio: 3
Triglycerides: 142 mg/dL (ref 0.0–149.0)
VLDL: 28.4 mg/dL (ref 0.0–40.0)

## 2023-10-27 LAB — CBC WITH DIFFERENTIAL/PLATELET
Basophils Absolute: 0 10*3/uL (ref 0.0–0.1)
Basophils Relative: 0.6 % (ref 0.0–3.0)
Eosinophils Absolute: 0.2 10*3/uL (ref 0.0–0.7)
Eosinophils Relative: 2.6 % (ref 0.0–5.0)
HCT: 45.6 % (ref 39.0–52.0)
Hemoglobin: 15.5 g/dL (ref 13.0–17.0)
Lymphocytes Relative: 32.1 % (ref 12.0–46.0)
Lymphs Abs: 2.3 10*3/uL (ref 0.7–4.0)
MCHC: 33.9 g/dL (ref 30.0–36.0)
MCV: 90.9 fL (ref 78.0–100.0)
Monocytes Absolute: 0.7 10*3/uL (ref 0.1–1.0)
Monocytes Relative: 9.3 % (ref 3.0–12.0)
Neutro Abs: 3.9 10*3/uL (ref 1.4–7.7)
Neutrophils Relative %: 55.4 % (ref 43.0–77.0)
Platelets: 228 10*3/uL (ref 150.0–400.0)
RBC: 5.02 Mil/uL (ref 4.22–5.81)
RDW: 13.6 % (ref 11.5–15.5)
WBC: 7.1 10*3/uL (ref 4.0–10.5)

## 2023-10-27 LAB — BASIC METABOLIC PANEL
BUN: 17 mg/dL (ref 6–23)
CO2: 29 meq/L (ref 19–32)
Calcium: 9.3 mg/dL (ref 8.4–10.5)
Chloride: 103 meq/L (ref 96–112)
Creatinine, Ser: 1.01 mg/dL (ref 0.40–1.50)
GFR: 83.86 mL/min (ref >60.00)
Glucose, Bld: 97 mg/dL (ref 70–99)
Potassium: 4.3 meq/L (ref 3.5–5.1)
Sodium: 141 meq/L (ref 135–145)

## 2023-10-27 LAB — HEPATIC FUNCTION PANEL
ALT: 20 U/L (ref 0–53)
AST: 17 U/L (ref 0–37)
Albumin: 4.7 g/dL (ref 3.5–5.2)
Alkaline Phosphatase: 69 U/L (ref 39–117)
Bilirubin, Direct: 0.3 mg/dL (ref 0.0–0.3)
Total Bilirubin: 1.6 mg/dL — ABNORMAL HIGH (ref 0.2–1.2)
Total Protein: 6.8 g/dL (ref 6.0–8.3)

## 2023-10-27 LAB — TSH: TSH: 0.61 u[IU]/mL (ref 0.35–5.50)

## 2023-10-27 LAB — PSA: PSA: 0.54 ng/mL (ref 0.10–4.00)

## 2023-10-27 LAB — T3, FREE: T3, Free: 3 pg/mL (ref 2.3–4.2)

## 2023-10-27 LAB — HEMOGLOBIN A1C: Hgb A1c MFr Bld: 5.8 % (ref 4.6–6.5)

## 2023-10-27 LAB — T4, FREE: Free T4: 1.52 ng/dL (ref 0.60–1.60)

## 2023-11-03 ENCOUNTER — Encounter: Payer: BC Managed Care – PPO | Admitting: Family Medicine

## 2023-11-25 ENCOUNTER — Encounter: Payer: Self-pay | Admitting: Family Medicine

## 2023-12-05 ENCOUNTER — Other Ambulatory Visit: Payer: Self-pay | Admitting: Family Medicine

## 2023-12-05 DIAGNOSIS — G43801 Other migraine, not intractable, with status migrainosus: Secondary | ICD-10-CM

## 2024-09-09 ENCOUNTER — Encounter: Payer: Self-pay | Admitting: Family Medicine

## 2024-09-09 ENCOUNTER — Ambulatory Visit (INDEPENDENT_AMBULATORY_CARE_PROVIDER_SITE_OTHER): Admitting: Family Medicine

## 2024-09-09 VITALS — BP 110/68 | HR 68 | Temp 98.1°F | Wt 198.0 lb

## 2024-09-09 DIAGNOSIS — H9313 Tinnitus, bilateral: Secondary | ICD-10-CM | POA: Diagnosis not present

## 2024-09-09 NOTE — Progress Notes (Signed)
   Subjective:    Patient ID: Derrick Medina, male    DOB: 1966/08/18, 58 y.o.   MRN: [id number]  HPI Here for the sudden onset of constant ringing in both ears. No hx of head trauma. No recent loud noise exposures. He denies any ear pain, hearing loss, or dizziness. He notes that his mother has been treated for Meniere's disease.    Review of Systems  Constitutional: Negative.   HENT:  Negative for congestion, ear pain, hearing loss, postnasal drip and sinus pain.   Eyes: Negative.   Respiratory: Negative.         Objective:   Physical Exam Constitutional:      Appearance: Normal appearance.  HENT:     Right Ear: Tympanic membrane, ear canal and external ear normal.     Left Ear: Tympanic membrane, ear canal and external ear normal.  Cardiovascular:     Rate and Rhythm: Normal rate and regular rhythm.     Pulses: Normal pulses.     Heart sounds: Normal heart sounds.  Pulmonary:     Effort: Pulmonary effort is normal.     Breath sounds: Normal breath sounds.  Neurological:     Mental Status: He is alert.           Assessment & Plan:  Bilateral tinnitus, refer to ENT. Garnette Olmsted, MD

## 2024-09-18 ENCOUNTER — Encounter: Payer: Self-pay | Admitting: Family Medicine

## 2024-09-20 ENCOUNTER — Encounter: Payer: Self-pay | Admitting: Family Medicine

## 2024-09-20 ENCOUNTER — Ambulatory Visit (INDEPENDENT_AMBULATORY_CARE_PROVIDER_SITE_OTHER): Admitting: Family Medicine

## 2024-09-20 VITALS — BP 110/82 | HR 66 | Temp 98.1°F | Wt 197.0 lb

## 2024-09-20 DIAGNOSIS — G43811 Other migraine, intractable, with status migrainosus: Secondary | ICD-10-CM | POA: Diagnosis not present

## 2024-09-20 MED ORDER — METHYLPREDNISOLONE 4 MG PO TBPK: ORAL_TABLET | ORAL | 0 refills | Status: DC

## 2024-09-20 MED ORDER — KETOROLAC TROMETHAMINE 60 MG/2ML IM SOLN
60.0000 mg | Freq: Once | INTRAMUSCULAR | Status: AC
  Administered 2024-09-20: 60 mg via INTRAMUSCULAR

## 2024-09-20 NOTE — Addendum Note (Signed)
 Addended by: LADONNA INOCENTE SAILOR on: 09/20/2024 04:38 PM   Modules accepted: Orders

## 2024-09-20 NOTE — Progress Notes (Signed)
   Subjective:    Patient ID: Derrick Medina, male    DOB: Apr 20, 1966, 58 y.o.   MRN: [id number]  HPI Here for a migraine headache that started 11 days ago, and it won't go away. He gets brief relief by taking a Sumatriptan , but then it comes back a couple hours later. This began in the right temple and then generalized over the entire head, which is usual headache pattern. He has mild nausea but has not vomited. No blurred vision.    Review of Systems  Constitutional: Negative.   Respiratory: Negative.    Cardiovascular: Negative.   Neurological:  Positive for headaches.       Objective:   Physical Exam Constitutional:      Appearance: Normal appearance.  Eyes:     Pupils: Pupils are equal, round, and reactive to light.     Comments: No photophobia   Cardiovascular:     Rate and Rhythm: Normal rate and regular rhythm.     Pulses: Normal pulses.     Heart sounds: Normal heart sounds.  Pulmonary:     Effort: Pulmonary effort is normal.     Breath sounds: Normal breath sounds.  Neurological:     Mental Status: He is alert and oriented to person, place, and time. Mental status is at baseline.           Assessment & Plan:  Intractable migraine. He is given a shot of Toradol, and he will start a Medrol dose pack. He can add Sumatriptan  as needed. He will return if not better in 2 days. Garnette Olmsted, MD

## 2024-09-28 ENCOUNTER — Ambulatory Visit: Payer: Self-pay

## 2024-09-28 NOTE — Telephone Encounter (Signed)
 Appt scheduled

## 2024-09-28 NOTE — Telephone Encounter (Signed)
 FYI Only or Action Required?: FYI only for provider: appointment scheduled on 11/20.  Patient was last seen in primary care on 09/20/2024 by Johnny Garnette LABOR, MD.  Called Nurse Triage reporting Migraine.  Symptoms began a week ago.  Interventions attempted: Prescription medications: Medrol Dosepak, Toradol, Sumatriptan .  Symptoms are: gradually worsening .  Triage Disposition: Call PCP Now  Patient/caregiver understands and will follow disposition?: Yes  Copied from CRM [id number]. Topic: Clinical - Red Word Triage >> Sep 28, 2024  2:10 PM Shardie S wrote: [facility] that prompted transfer to Nurse Triage: migraine-worsening Reason for Disposition  [1] SEVERE pain (e.g., excruciating, pain scale 8-10) AND [2] not improved after pain medications  Answer Assessment - Initial Assessment Questions Finished Medrol Dosepak and Toradol injection. Eased off and now migraine has returned. He gets cluster migraines for about a month straight every year or every 2 years.  Wife denies any new symptoms. Pt currently on plane returning home from [place]  Sumatriptan - helping some and not working as well. Taking at the start of the migraine and every couple hours- Reviewed no more then 2 doses in 24hrs to limit rebound migraines. Wife aware and says he has been doing this for years.  Not on preventative medication- sumatriptan  abortive.  Oxygen Therapy treatment suggested by Dr Johnny at [facility]. Would like to try.   1. MAIN CONCERN OR SYMPTOM:  What is your main concern right now? What question do you have? What's the main symptom you're worried about? (e.g., breathing difficulty, cough, fever, pain)     Cluster Migraine 2. ONSET: When did the  migraine  start?     Last few days 3. BETTER-SAME-WORSE: Are you getting better, staying the same, or getting worse compared to how you felt at your last visit to the doctor (most recent medical visit)?     same 4. VISIT DATE: When were you seen?  (e.g., date)     11/11 5. VISIT DOCTOR: What is the name of the doctor taking care of you now?     Dr Johnny 6. VISIT DIAGNOSIS:  What was the main symptom or problem that you were seen for? Were you given a diagnosis?      Cluster Migraine 7. VISIT MEDICINES: Did the doctor order any new medicines for you to use? If Yes, ask: Have you filled the prescription and started taking the medicine?      Medrol DosePak and Toradol injection  8. NEXT APPOINTMENT: Have you scheduled a follow-up appointment with your doctor?     11/20 9. PAIN: Is there any pain? If Yes, ask: How bad is it?  (Scale 0-10; or none, mild, moderate, severe)     Typical cluster migraine for him  10. FEVER: Do you have a fever? If Yes, ask: What is it, how was it measured  and when did it start?       denies 11. OTHER SYMPTOMS: Do you have any other symptoms?       UTA- pt on a plane currently  Protocols used: Recent Medical Visit for Illness Follow-up Call-A-[facility]

## 2024-09-28 NOTE — Telephone Encounter (Signed)
 I am not able to get oxygen therapy approved, that would likely take a RX from a neurologist. I would like to refer him to Neurology for these headaches if agrees. In the meantime I have samples of a new migraine medication called Nurtec to try. He can come by the office and pick these up at his convenience.

## 2024-09-29 ENCOUNTER — Encounter: Payer: Self-pay | Admitting: Family Medicine

## 2024-09-29 ENCOUNTER — Ambulatory Visit (INDEPENDENT_AMBULATORY_CARE_PROVIDER_SITE_OTHER): Admitting: Family Medicine

## 2024-09-29 VITALS — BP 118/72 | HR 66 | Temp 98.4°F | Wt 191.0 lb

## 2024-09-29 DIAGNOSIS — G43811 Other migraine, intractable, with status migrainosus: Secondary | ICD-10-CM

## 2024-09-29 MED ORDER — NURTEC 75 MG PO TBDP
ORAL_TABLET | ORAL | Status: AC
Start: 2024-09-29 — End: ?

## 2024-09-29 NOTE — Progress Notes (Signed)
   Subjective:    Patient ID: Derrick Medina, male    DOB: 1966/09/26, 58 y.o.   MRN: [id number]  HPI Here to follow up on migraine headaches. He has now had recurrent headaches for the past 3 weeks. He says Sumatriptan  will take the edge off for a few hours but then the headaches return. We saw him on 09-20-24 for this, and we gave him a shot of Toradol and a Medrol dose pack. This did not help much. Today he has a medium intensity headache.    Review of Systems  Constitutional: Negative.   Respiratory: Negative.    Cardiovascular: Negative.   Neurological:  Positive for headaches.       Objective:   Physical Exam Constitutional:      Appearance: Normal appearance.  Eyes:     Pupils: Pupils are equal, round, and reactive to light.     Comments: No photophobia   Cardiovascular:     Rate and Rhythm: Normal rate and regular rhythm.     Pulses: Normal pulses.     Heart sounds: Normal heart sounds.  Pulmonary:     Breath sounds: Normal breath sounds.  Neurological:     Mental Status: He is alert and oriented to person, place, and time. Mental status is at baseline.           Assessment & Plan:  Headaches, possibly cluster headaches. While he was here today we treated him with 10 liters of [place] oxygen for 30 minutes. After that he said he felt fine. We will give him samples to try Nurtec 75 mg instead of Sumatriptan  if the headache returns. We also referred him to Neurology to evaluate.  Garnette Olmsted, MD

## 2024-09-30 ENCOUNTER — Encounter: Payer: Self-pay | Admitting: Neurology

## 2024-10-14 DIAGNOSIS — H9042 Sensorineural hearing loss, unilateral, left ear, with unrestricted hearing on the contralateral side: Secondary | ICD-10-CM | POA: Diagnosis not present

## 2024-10-14 DIAGNOSIS — H9313 Tinnitus, bilateral: Secondary | ICD-10-CM | POA: Diagnosis not present

## 2024-10-14 DIAGNOSIS — J342 Deviated nasal septum: Secondary | ICD-10-CM | POA: Diagnosis not present

## 2024-10-27 ENCOUNTER — Ambulatory Visit: Payer: Self-pay | Admitting: Family Medicine

## 2024-10-27 ENCOUNTER — Ambulatory Visit: Admitting: Family Medicine

## 2024-10-27 ENCOUNTER — Encounter: Payer: Self-pay | Admitting: Family Medicine

## 2024-10-27 VITALS — BP 110/74 | HR 59 | Temp 98.0°F | Ht 70.5 in | Wt 192.6 lb

## 2024-10-27 DIAGNOSIS — G43801 Other migraine, not intractable, with status migrainosus: Secondary | ICD-10-CM

## 2024-10-27 DIAGNOSIS — Z23 Encounter for immunization: Secondary | ICD-10-CM | POA: Diagnosis not present

## 2024-10-27 DIAGNOSIS — Z0000 Encounter for general adult medical examination without abnormal findings: Secondary | ICD-10-CM | POA: Diagnosis not present

## 2024-10-27 DIAGNOSIS — I10 Essential (primary) hypertension: Secondary | ICD-10-CM

## 2024-10-27 DIAGNOSIS — E039 Hypothyroidism, unspecified: Secondary | ICD-10-CM

## 2024-10-27 LAB — CBC WITH DIFFERENTIAL/PLATELET
Basophils Absolute: 0 K/uL (ref 0.0–0.1)
Basophils Relative: 0.4 % (ref 0.0–3.0)
Eosinophils Absolute: 0.2 K/uL (ref 0.0–0.7)
Eosinophils Relative: 2.4 % (ref 0.0–5.0)
HCT: 45.1 % (ref 39.0–52.0)
Hemoglobin: 15.2 g/dL (ref 13.0–17.0)
Lymphocytes Relative: 30.4 % (ref 12.0–46.0)
Lymphs Abs: 2.3 K/uL (ref 0.7–4.0)
MCHC: 33.6 g/dL (ref 30.0–36.0)
MCV: 88.4 fl (ref 78.0–100.0)
Monocytes Absolute: 0.8 K/uL (ref 0.1–1.0)
Monocytes Relative: 9.8 % (ref 3.0–12.0)
Neutro Abs: 4.4 K/uL (ref 1.4–7.7)
Neutrophils Relative %: 57 % (ref 43.0–77.0)
Platelets: 247 K/uL (ref 150.0–400.0)
RBC: 5.11 Mil/uL (ref 4.22–5.81)
RDW: 13.1 % (ref 11.5–15.5)
WBC: 7.7 K/uL (ref 4.0–10.5)

## 2024-10-27 LAB — BASIC METABOLIC PANEL WITH GFR
BUN: 19 mg/dL (ref 6–23)
CO2: 28 meq/L (ref 19–32)
Calcium: 9.1 mg/dL (ref 8.4–10.5)
Chloride: 104 meq/L (ref 96–112)
Creatinine, Ser: 0.98 mg/dL (ref 0.40–1.50)
GFR: 86.34 mL/min (ref >60.00)
Glucose, Bld: 87 mg/dL (ref 70–99)
Potassium: 4.2 meq/L (ref 3.5–5.1)
Sodium: 142 meq/L (ref 135–145)

## 2024-10-27 LAB — HEPATIC FUNCTION PANEL
ALT: 21 U/L (ref 3–53)
AST: 16 U/L (ref 5–37)
Albumin: 4.6 g/dL (ref 3.5–5.2)
Alkaline Phosphatase: 61 U/L (ref 39–117)
Bilirubin, Direct: 0.2 mg/dL (ref 0.1–0.3)
Total Bilirubin: 1.4 mg/dL — ABNORMAL HIGH (ref 0.2–1.2)
Total Protein: 6.6 g/dL (ref 6.0–8.3)

## 2024-10-27 LAB — T4, FREE: Free T4: 1.35 ng/dL (ref 0.60–1.60)

## 2024-10-27 LAB — LIPID PANEL
Cholesterol: 137 mg/dL (ref 28–200)
HDL: 39.9 mg/dL (ref >39.00)
LDL Cholesterol: 75 mg/dL (ref 10–99)
NonHDL: 97.43
Total CHOL/HDL Ratio: 3
Triglycerides: 111 mg/dL (ref 10.0–149.0)
VLDL: 22.2 mg/dL (ref 0.0–40.0)

## 2024-10-27 LAB — TSH: TSH: 0.28 u[IU]/mL — ABNORMAL LOW (ref 0.35–5.50)

## 2024-10-27 LAB — PSA: PSA: 0.51 ng/mL (ref 0.10–4.00)

## 2024-10-27 LAB — T3, FREE: T3, Free: 3.6 pg/mL (ref 2.3–4.2)

## 2024-10-27 LAB — HEMOGLOBIN A1C: Hgb A1c MFr Bld: 5.8 % (ref 4.6–6.5)

## 2024-10-27 MED ORDER — METOPROLOL SUCCINATE ER 50 MG PO TB24: 50.0000 mg | ORAL_TABLET | Freq: Every day | ORAL | 3 refills | Status: AC

## 2024-10-27 MED ORDER — LEVOTHYROXINE SODIUM 125 MCG PO TABS: 125.0000 ug | ORAL_TABLET | Freq: Every day | ORAL | 3 refills | Status: AC

## 2024-10-27 MED ORDER — SUMATRIPTAN SUCCINATE 50 MG PO TABS: ORAL_TABLET | ORAL | 5 refills | Status: DC

## 2024-10-27 MED ORDER — ATORVASTATIN CALCIUM 20 MG PO TABS: 20.0000 mg | ORAL_TABLET | Freq: Every day | ORAL | 3 refills | Status: AC

## 2024-10-27 NOTE — Progress Notes (Signed)
 Subjective:    Patient ID: Derrick Medina, male    DOB: [birth year], 58 y.o.   MRN: [id number]  HPI Here for a well exam. He feels fine. He was here for a series of migraines last month, but these went away.    Review of Systems  Constitutional: Negative.   HENT: Negative.    Eyes: Negative.   Respiratory: Negative.    Cardiovascular: Negative.   Gastrointestinal: Negative.   Genitourinary: Negative.   Musculoskeletal: Negative.   Skin: Negative.   Neurological: Negative.   Psychiatric/Behavioral: Negative.         Objective:   Physical Exam Constitutional:      General: He is not in acute distress.    Appearance: Normal appearance. He is well-developed. He is not diaphoretic.  HENT:     Head: Normocephalic and atraumatic.     Right Ear: External ear normal.     Left Ear: External ear normal.     Nose: Nose normal.     Mouth/Throat:     Pharynx: No oropharyngeal exudate.  Eyes:     General: No scleral icterus.       Right eye: No discharge.        Left eye: No discharge.     Conjunctiva/sclera: Conjunctivae normal.     Pupils: Pupils are equal, round, and reactive to light.  Neck:     Thyroid : No thyromegaly.     Vascular: No JVD.     Trachea: No tracheal deviation.  Cardiovascular:     Rate and Rhythm: Normal rate and regular rhythm.     Pulses: Normal pulses.     Heart sounds: Normal heart sounds. No murmur heard.    No friction rub. No gallop.  Pulmonary:     Effort: Pulmonary effort is normal. No respiratory distress.     Breath sounds: Normal breath sounds. No wheezing or rales.  Chest:     Chest wall: No tenderness.  Abdominal:     General: Bowel sounds are normal. There is no distension.     Palpations: Abdomen is soft. There is no mass.     Tenderness: There is no abdominal tenderness. There is no guarding or rebound.  Genitourinary:    Penis: Normal. No tenderness.      Testes: Normal.     Prostate: Normal.     Rectum: Normal. Guaiac result  negative.  Musculoskeletal:        General: No tenderness. Normal range of motion.     Cervical back: Neck supple.  Lymphadenopathy:     Cervical: No cervical adenopathy.  Skin:    General: Skin is warm and dry.     Coloration: Skin is not pale.     Findings: No erythema or rash.  Neurological:     General: No focal deficit present.     Mental Status: He is alert and oriented to person, place, and time.     Cranial Nerves: No cranial nerve deficit.     Motor: No abnormal muscle tone.     Coordination: Coordination normal.     Deep Tendon Reflexes: Reflexes are normal and symmetric. Reflexes normal.  Psychiatric:        Mood and Affect: Mood normal.        Behavior: Behavior normal.        Thought Content: Thought content normal.        Judgment: Judgment normal.           Assessment &  Plan:  Well exam. We discussed diet and exercise. Get fasting labs. Garnette Olmsted, MD

## 2024-10-27 NOTE — Addendum Note (Signed)
 Addended by: LADONNA INOCENTE SAILOR on: 10/27/2024 09:56 AM   Modules accepted: Orders

## 2024-12-14 ENCOUNTER — Other Ambulatory Visit: Payer: Self-pay | Admitting: Family Medicine

## 2024-12-14 DIAGNOSIS — G43801 Other migraine, not intractable, with status migrainosus: Secondary | ICD-10-CM

## 2025-01-09 ENCOUNTER — Ambulatory Visit: Admitting: Neurology
